# Patient Record
Sex: Male | Born: 1985 | Race: Black or African American | Hispanic: No | Marital: Single
Health system: Midwestern US, Community
[De-identification: ages and names within clinical notes are randomized; demographics above are authoritative.]

## PROBLEM LIST (undated history)

## (undated) DIAGNOSIS — M5412 Radiculopathy, cervical region: Secondary | ICD-10-CM

---
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
## 2007-01-28 ENCOUNTER — Ambulatory Visit (HOSPITAL_COMMUNITY): Admission: RE | Admit: 2007-01-28 | Discharge: 2007-01-28 | Payer: Self-pay | Admitting: Family Medicine

## 2010-08-26 NOTE — ED Notes (Signed)
Pt to xray via W/C / ice pack on ankle  / med already given by Basilia Jumbo RN.
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
## 2010-08-26 NOTE — ED Notes (Signed)
Patient reports hearing pop and feeling a crack to right ankle in ball injury after stepping on another player.

## 2010-08-26 NOTE — ED Notes (Signed)
Pt back to room / xray completed.

## 2010-08-26 NOTE — ED Notes (Signed)
Crutches with instructions given / ACE wrap already applied to right ankle by PA.

## 2010-08-26 NOTE — ED Notes (Signed)
Pt d/c'd, via W/C.  Pain 10 out of 10 per pt.  Prescrip and instructions given / pt verbalized understanding and signed out manually.

## 2010-08-27 MED ORDER — TRAMADOL 50 MG TAB
50 mg | ORAL_TABLET | Freq: Four times a day (QID) | ORAL | Status: AC | PRN
Start: 2010-08-27 — End: 2010-08-31

## 2010-08-27 MED ORDER — HYDROCODONE-ACETAMINOPHEN 5 MG-325 MG TAB
5-325 mg | ORAL | Status: AC
Start: 2010-08-27 — End: 2010-08-26
  Administered 2010-08-27: 02:00:00 via ORAL

## 2012-03-06 DIAGNOSIS — X58XXXA Exposure to other specified factors, initial encounter: Secondary | ICD-10-CM | POA: Insufficient documentation

## 2012-03-06 DIAGNOSIS — R5381 Other malaise: Secondary | ICD-10-CM | POA: Insufficient documentation

## 2012-03-06 DIAGNOSIS — R5383 Other fatigue: Secondary | ICD-10-CM | POA: Insufficient documentation

## 2012-03-06 DIAGNOSIS — M25579 Pain in unspecified ankle and joints of unspecified foot: Secondary | ICD-10-CM | POA: Insufficient documentation

## 2012-03-07 ENCOUNTER — Emergency Department (HOSPITAL_COMMUNITY): Payer: Self-pay

## 2012-03-07 ENCOUNTER — Encounter (HOSPITAL_COMMUNITY): Payer: Self-pay | Admitting: Emergency Medicine

## 2012-03-07 ENCOUNTER — Emergency Department (HOSPITAL_COMMUNITY)
Admission: EM | Admit: 2012-03-07 | Discharge: 2012-03-07 | Disposition: A | Discharge status: Home / Self Care | Payer: Self-pay | Attending: Emergency Medicine | Admitting: Emergency Medicine

## 2012-03-07 DIAGNOSIS — M25572 Pain in left ankle and joints of left foot: Secondary | ICD-10-CM

## 2012-03-07 MED ORDER — ACETAMINOPHEN-CODEINE #3 300-30 MG PO TABS: 1.0000 | ORAL_TABLET | Freq: Four times a day (QID) | ORAL | Status: AC | PRN

## 2012-03-07 NOTE — ED Provider Notes (Signed)
History     CSN: [id number]  Arrival date & time 03/06/12  2349   First MD Initiated Contact with Patient 03/07/12 [phone]      Chief Complaint  Patient presents with  . Ankle Pain    (Consider location/radiation/quality/duration/timing/severity/associated sxs/prior treatment) Patient is a 26 y.o. male presenting with ankle pain. The history is provided by the patient.  Ankle Pain  The incident occurred less than 1 hour ago. The incident occurred at the gym. Injury mechanism: unsure. The pain is present in the left ankle. The quality of the pain is described as throbbing. The pain is moderate. The pain has been intermittent since onset. Associated symptoms include inability to bear weight and loss of motion. Pertinent negatives include no numbness, no muscle weakness, no loss of sensation and no tingling. He reports no foreign bodies present. The symptoms are aggravated by bearing weight. He has tried nothing for the symptoms.   Pt states he was playing basketball earlier this evening; he did not suffer any known, specific, injury to his ankle, but thought he may have gotten kicked in the posterior calf on one play. When he took a break, he began to notice pain to the posterior L ankle and bottom of his heel, and was limping. States as time progressed, he began to have increased pain to the area and stated it was hard to flex the ankle, especially with walking. He has not noted any deformity to the area. He is very concerned about possible Achilles injury.  History reviewed. No pertinent past medical history.  History reviewed. No pertinent past surgical history.  History reviewed. No pertinent family history.  History  Substance Use Topics  . Smoking status: Never Smoker   . Smokeless tobacco: Not on file  . Alcohol Use: No      Review of Systems  Constitutional:       Negative  Musculoskeletal: Positive for arthralgias.  Skin: Negative for color change and rash.  Neurological:  Positive for weakness. Negative for tingling and numbness.    Allergies  Review of patient's allergies indicates no known allergies.  Home Medications   Current Outpatient Rx  Name Route Sig Dispense Refill  . ACETAMINOPHEN-CODEINE #3 300-30 MG PO TABS Oral Take 1-2 tablets by mouth every 6 (six) hours as needed for pain. 15 tablet 0    BP 127/54  Pulse 69  Temp(Src) 97.7 F (36.5 C) (Oral)  Resp 20  SpO2 98%  Physical Exam  Nursing note and vitals reviewed. Constitutional: He appears well-developed and well-nourished. No distress.  HENT:  Head: Normocephalic and atraumatic.  Neck: Normal range of motion.  Cardiovascular: Normal rate.   Pulmonary/Chest: Effort normal.  Musculoskeletal: Normal range of motion.       L ankle: tender to palpation over plantar surface of calcaneus. Achilles intact to [facility] test. Pt with reduced ROM 2/2 pain in ankle. No palp stepoff, crepitus, deformity. Calf supple, NT. Neurovascularly intact with sensory intact to light touch. Good DP/PT pulses. Capillary refill less than 3 seconds.  Pt able to bear wt but cannot take a step.  Neurological: He is alert.  Skin: Skin is warm and dry. He is not diaphoretic.  Psychiatric: He has a normal mood and affect.       Anxious about his condition    ED Course  Procedures (including critical care time)  Labs Reviewed - No data to display Dg Ankle Complete Left  03/07/2012  *RADIOLOGY REPORT*  Clinical Data: Ankle injury.  LEFT ANKLE COMPLETE - 3+ VIEW  Comparison: None.  Findings: There is no evidence for fracture, subluxation or dislocation.  No worrisome lytic or sclerotic osseous lesion.  IMPRESSION: No acute bony findings.  Original Report Authenticated By: ERIC A. MANSELL, M.D.     1. Left ankle pain       MDM  Pt presents with posterior ankle pain after playing basketball. He is very concerned about an achilles rupture. On my exam, his Achilles is definitely intact to [facility] test and  there is no palpable deformity to the calf. He does have some slight tenderness to the plantar surface of calcaneus. I personally reviewed plain films, which are negative. He is unable to wt bear/walk without pain so will place in posterior splint and on crutches; will have him f/u with ortho. RICE advised. Return precautions discussed.        Grant Fontana, [place] 03/07/12 [phone]

## 2012-03-07 NOTE — ED Notes (Signed)
Ortho tech at pt bedside 

## 2012-03-07 NOTE — ED Notes (Signed)
Pt states he was playing basketball and injured his left ankle  Pt states he has pain in his heel up the back of his ankle area  Pt states it does not feel right  No swelling noted  Pt states he can flex it a little

## 2012-03-07 NOTE — Discharge Instructions (Signed)
Your xrays did not show any broken bones. Your Achilles tendon appears to be intact on exam. You have been placed in a splint and on crutches for comfort. Please follow up with Dr. Montez Morita in clinic in the next 1-2 days for a recheck. If you develop increased swelling, numbness, weakness, or any other worrisome symptoms, please return to the ED.  Ankle Pain Ankle pain is a common symptom. The bones, cartilage, tendons, and muscles of the ankle joint perform a lot of work each day. The ankle joint holds your body weight and allows you to move around. Ankle pain can occur on either side or back of 1 or both ankles. Ankle pain may be sharp and burning or dull and aching. There may be tenderness, stiffness, redness, or warmth around the ankle. The pain occurs more often when a person walks or puts pressure on the ankle. CAUSES  There are many reasons ankle pain can develop. It is important to work with your caregiver to identify the cause since many conditions can impact the bones, cartilage, muscles, and tendons. Causes for ankle pain include:  Injury, including a break (fracture), sprain, or strain often due to a fall, sports, or a high-impact activity.   Swelling (inflammation) of a tendon (tendonitis).   Achilles tendon rupture.   Ankle instability after repeated sprains and strains.   Poor foot alignment.   Pressure on a nerve (tarsal tunnel syndrome).   Arthritis in the ankle or the lining of the ankle.   Crystal formation in the ankle (gout or pseudogout).  DIAGNOSIS  A diagnosis is based on your medical history, your symptoms, results of your physical exam, and results of diagnostic tests. Diagnostic tests may include X-ray exams or a computerized magnetic scan (magnetic resonance imaging, MRI). TREATMENT  Treatment will depend on the cause of your ankle pain and may include:  Keeping pressure off the ankle and limiting activities.   Using crutches or other walking support (a cane or  brace).   Using rest, ice, compression, and elevation.   Participating in physical therapy or home exercises.   Wearing shoe inserts or special shoes.   Losing weight.   Taking medications to reduce pain or swelling or receiving an injection.   Undergoing surgery.  HOME CARE INSTRUCTIONS   Only take over-the-counter or prescription medicines for pain, discomfort, or fever as directed by your caregiver.   Put ice on the injured area.   Put ice in a plastic bag.   Place a towel between your skin and the bag.   Leave the ice on for 15 to 20 minutes at a time, 3 to 4 times a day.   Keep your leg raised (elevated) when possible to lessen swelling.   Avoid activities that cause ankle pain.   Follow specific exercises as directed by your caregiver.   Record how often you have ankle pain, the location of the pain, and what it feels like. This information may be helpful to you and your caregiver.   Ask your caregiver about returning to work or sports and whether you should drive.   Follow up with your caregiver for further examination, therapy, or testing as directed.  SEEK MEDICAL CARE IF:   Pain or swelling continues or worsens beyond 1 week.   You have an oral temperature above 102 F (38.9 C).   You are feeling unwell or have chills.   You are having an increasingly difficult time with walking.   You have loss  of sensation or other new symptoms.   You have questions or concerns.  MAKE SURE YOU:   Understand these instructions.   Will watch your condition.   Will get help right away if you are not doing well or get worse.  Document Released: 04/06/2010 Document Revised: 10/06/2011 Document Reviewed: 04/06/2010 [facility] Patient Information 2012 [place], [place].

## 2012-03-10 NOTE — ED Provider Notes (Signed)
Medical screening examination/treatment/procedure(s) were performed by non-physician practitioner and as supervising physician I was immediately available for consultation/collaboration.   Suzi Roots, MD 03/10/12 [phone]

## 2012-03-15 ENCOUNTER — Encounter (HOSPITAL_COMMUNITY): Payer: Self-pay | Admitting: Emergency Medicine

## 2012-03-15 ENCOUNTER — Emergency Department (HOSPITAL_COMMUNITY)
Admission: EM | Admit: 2012-03-15 | Discharge: 2012-03-15 | Disposition: A | Discharge status: Home / Self Care | Payer: Self-pay | Attending: Emergency Medicine | Admitting: Emergency Medicine

## 2012-03-15 ENCOUNTER — Emergency Department (HOSPITAL_COMMUNITY): Payer: Self-pay

## 2012-03-15 DIAGNOSIS — M79609 Pain in unspecified limb: Secondary | ICD-10-CM | POA: Insufficient documentation

## 2012-03-15 DIAGNOSIS — M79673 Pain in unspecified foot: Secondary | ICD-10-CM

## 2012-03-15 MED ORDER — IBUPROFEN 400 MG PO TABS: 400.0000 mg | ORAL_TABLET | Freq: Four times a day (QID) | ORAL | Status: AC | PRN

## 2012-03-15 MED ORDER — OXYCODONE-ACETAMINOPHEN 5-325 MG PO TABS: 1.0000 | ORAL_TABLET | ORAL | Status: AC | PRN

## 2012-03-15 NOTE — ED Notes (Signed)
Pt seen here Tuesday after injuring left ankle/foot while playing basketball. Pt reports his cast got wet and fell off. Pt still c/o of pain to heel and unable to walk. Pt back for a recheck.

## 2012-03-15 NOTE — Progress Notes (Signed)
ED CM noted no pcp with [place] residence listed Cm spoke with pt who states he has no coverage but states he is not a resident of [place] No resources available

## 2012-03-15 NOTE — ED Provider Notes (Signed)
History    26yM with L heel pain. Persistent since last Tuesday. Injured while playing basketball but cant describe exact mechanism. Evaluated in ED then and had normal films of ankle. Splinted and referred to ortho but has not followed up. Swelling and pain improving but still having pain when bears weight on heel. Little pain at rest. No numbness, tingling or loss of strength. No other complaints.  CSN: [id number]  Arrival date & time 03/15/12  1319   First MD Initiated Contact with Patient 03/15/12 1348      No chief complaint on file.   (Consider location/radiation/quality/duration/timing/severity/associated sxs/prior treatment) HPI  History reviewed. No pertinent past medical history.  History reviewed. No pertinent past surgical history.  No family history on file.  History  Substance Use Topics  . Smoking status: Never Smoker   . Smokeless tobacco: Not on file  . Alcohol Use: No      Review of Systems   Review of symptoms negative unless otherwise noted in HPI.   Allergies  Review of patient's allergies indicates no known allergies.  Home Medications   Current Outpatient Rx  Name Route Sig Dispense Refill  . ACETAMINOPHEN-CODEINE #3 300-30 MG PO TABS Oral Take 1-2 tablets by mouth every 6 (six) hours as needed for pain. 15 tablet 0  . IBUPROFEN 400 MG PO TABS Oral Take 1 tablet (400 mg total) by mouth every 6 (six) hours as needed for pain. 60 tablet 0  . OXYCODONE-ACETAMINOPHEN 5-325 MG PO TABS Oral Take 1-2 tablets by mouth every 4 (four) hours as needed for pain. 15 tablet 0    BP 113/80  Pulse 55  Temp 98.7 F (37.1 C)  Resp 18  SpO2 100%  Physical Exam  Nursing note and vitals reviewed. Constitutional: He appears well-developed and well-nourished. No distress.  HENT:  Head: Normocephalic and atraumatic.  Eyes: Conjunctivae are normal. Right eye exhibits no discharge. Left eye exhibits no discharge.  Neck: Neck supple.  Cardiovascular: Normal  rate, regular rhythm and normal heart sounds.  Exam reveals no gallop and no friction rub.   No murmur heard. Pulmonary/Chest: Effort normal and breath sounds normal. No respiratory distress.  Musculoskeletal: He exhibits tenderness. He exhibits no edema.       L foot and ankle grossly normal and symmetric as compared to L. Tenderness to plantar aspect of posterior calcaneus. No overlying skin changes and neurovascularly intact distally. No tenderness along insertion site of achilles into calcaneus. Normal thompson's test.   Neurological: He is alert.  Skin: Skin is warm and dry.  Psychiatric: He has a normal mood and affect. His behavior is normal. Thought content normal.    ED Course  Procedures (including critical care time)  Labs Reviewed - No data to display Dg Foot Complete Left  03/15/2012  *RADIOLOGY REPORT*  Clinical Data: Left heel pain.  LEFT FOOT - COMPLETE 3+ VIEW  Comparison: None.  Findings: No acute bony abnormality.  Specifically, no fracture, subluxation, or dislocation.  Soft tissues are intact.  IMPRESSION: Unremarkable study.  Original Report Authenticated By: Cyndie Chime, M.D.     1. Heel pain       MDM  26yM with persistent heel pain. XR neg for fx. May have occult calcaneus fx. CT/MRI will not change tx or disposition at this time though. Will resplint. Pain meds and ortho fu. Return precautions discussed.         Raeford Razor, MD 03/15/12 [phone]

## 2012-11-15 ENCOUNTER — Emergency Department (HOSPITAL_COMMUNITY)
Admission: EM | Admit: 2012-11-15 | Discharge: 2012-11-15 | Disposition: A | Discharge status: Home / Self Care | Payer: No Typology Code available for payment source | Attending: Emergency Medicine | Admitting: Emergency Medicine

## 2012-11-15 ENCOUNTER — Encounter (HOSPITAL_COMMUNITY): Payer: Self-pay

## 2012-11-15 ENCOUNTER — Emergency Department (HOSPITAL_COMMUNITY): Payer: No Typology Code available for payment source

## 2012-11-15 DIAGNOSIS — Y9389 Activity, other specified: Secondary | ICD-10-CM | POA: Insufficient documentation

## 2012-11-15 DIAGNOSIS — S8992XA Unspecified injury of left lower leg, initial encounter: Secondary | ICD-10-CM

## 2012-11-15 DIAGNOSIS — IMO0002 Reserved for concepts with insufficient information to code with codable children: Secondary | ICD-10-CM | POA: Insufficient documentation

## 2012-11-15 DIAGNOSIS — S8990XA Unspecified injury of unspecified lower leg, initial encounter: Secondary | ICD-10-CM | POA: Insufficient documentation

## 2012-11-15 DIAGNOSIS — S99919A Unspecified injury of unspecified ankle, initial encounter: Secondary | ICD-10-CM | POA: Insufficient documentation

## 2012-11-15 DIAGNOSIS — Y929 Unspecified place or not applicable: Secondary | ICD-10-CM | POA: Insufficient documentation

## 2012-11-15 MED ORDER — IBUPROFEN 600 MG PO TABS: 600.0000 mg | ORAL_TABLET | Freq: Four times a day (QID) | ORAL | Status: DC | PRN

## 2012-11-15 MED ORDER — IBUPROFEN 400 MG PO TABS
800.0000 mg | ORAL_TABLET | Freq: Once | ORAL | Status: AC
  Administered 2012-11-15: 800 mg via ORAL
  Filled 2012-11-15: qty 2

## 2012-11-15 MED ORDER — METHOCARBAMOL 500 MG PO TABS: 500.0000 mg | ORAL_TABLET | Freq: Two times a day (BID) | ORAL | Status: DC

## 2012-11-15 NOTE — ED Notes (Signed)
Pt at radiology  ?

## 2012-11-15 NOTE — ED Provider Notes (Signed)
Medical screening examination/treatment/procedure(s) were performed by non-physician practitioner and as supervising physician I was immediately available for consultation/collaboration.   Kyrstin Campillo, MD 11/15/12 1425 
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
## 2013-09-09 ENCOUNTER — Emergency Department (HOSPITAL_COMMUNITY)
Admission: EM | Admit: 2013-09-09 | Discharge: 2013-09-09 | Disposition: A | Discharge status: Home / Self Care | Payer: Self-pay | Attending: Emergency Medicine | Admitting: Emergency Medicine

## 2013-09-09 ENCOUNTER — Emergency Department (HOSPITAL_COMMUNITY): Payer: Self-pay

## 2013-09-09 ENCOUNTER — Encounter (HOSPITAL_COMMUNITY): Payer: Self-pay | Admitting: Emergency Medicine

## 2013-09-09 DIAGNOSIS — S8990XA Unspecified injury of unspecified lower leg, initial encounter: Secondary | ICD-10-CM | POA: Insufficient documentation

## 2013-09-09 DIAGNOSIS — Y9367 Activity, basketball: Secondary | ICD-10-CM | POA: Insufficient documentation

## 2013-09-09 DIAGNOSIS — Y9239 Other specified sports and athletic area as the place of occurrence of the external cause: Secondary | ICD-10-CM | POA: Insufficient documentation

## 2013-09-09 DIAGNOSIS — M25561 Pain in right knee: Secondary | ICD-10-CM

## 2013-09-09 DIAGNOSIS — X500XXA Overexertion from strenuous movement or load, initial encounter: Secondary | ICD-10-CM | POA: Insufficient documentation

## 2013-09-09 MED ORDER — TRAMADOL HCL 50 MG PO TABS: 50.0000 mg | ORAL_TABLET | Freq: Four times a day (QID) | ORAL | Status: DC | PRN

## 2013-09-09 MED ORDER — IBUPROFEN 600 MG PO TABS: 600.0000 mg | ORAL_TABLET | Freq: Four times a day (QID) | ORAL | Status: DC | PRN

## 2013-09-09 NOTE — ED Provider Notes (Signed)
CSN: [id number]     Arrival date & time 09/09/13  2045 History   First MD Initiated Contact with Patient 09/09/13 2111    This chart was scribed for [name] PA-C, a non-physician practitioner working with No att. providers found by Lewanda Rife, ED Scribe. This patient was seen in room WTR9/WTR9 and the patient's care was started at 5:15 AM   Chief Complaint  Patient presents with  . Knee Injury   (Consider location/radiation/quality/duration/timing/severity/associated sxs/prior Treatment) The history is provided by the patient. No language interpreter was used.   HPI Comments: Paul Cole is a 27 y.o. male who presents to the Emergency Department complaining of constant severe right knee pain onset 1.5 hours ago while playing basketball hyperextended knee after going for a rebound. Reports associated mild swelling. Reports pain is exacerbated by touch, flexion/extension, and weight bearing. Denies associated other injuries, numbness, and weakness.   History reviewed. No pertinent past medical history. History reviewed. No pertinent past surgical history. History reviewed. No pertinent family history. History  Substance Use Topics  . Smoking status: Never Smoker   . Smokeless tobacco: Not on file  . Alcohol Use: No    Review of Systems  Musculoskeletal: Positive for arthralgias.   A complete 10 system review of systems was obtained and all systems are negative except as noted in the HPI and PMHx.    Allergies  Review of patient's allergies indicates no known allergies.  Home Medications   Current Outpatient Rx  Name  Route  Sig  Dispense  Refill  . ibuprofen (ADVIL,MOTRIN) 600 MG tablet   Oral   Take 1 tablet (600 mg total) by mouth every 6 (six) hours as needed.   30 tablet   0   . traMADol (ULTRAM) 50 MG tablet   Oral   Take 1 tablet (50 mg total) by mouth every 6 (six) hours as needed.   15 tablet   0    BP 114/68  Pulse 56  Temp(Src) 98.2 F (36.8  C) (Oral)  Resp 16  SpO2 100%  Physical Exam  Nursing note and vitals reviewed. Constitutional: He is oriented to person, place, and time. He appears well-developed and well-nourished. No distress.  HENT:  Head: Normocephalic and atraumatic.  Eyes: Conjunctivae and EOM are normal. No scleral icterus.  Neck: Normal range of motion.  Cardiovascular: Normal rate, regular rhythm, intact distal pulses and normal pulses.   Pulses:      Dorsalis pedis pulses are 2+ on the right side.       Posterior tibial pulses are 2+ on the right side.  Pulmonary/Chest: Effort normal. No respiratory distress.  Musculoskeletal: Normal range of motion. He exhibits tenderness.  TTP of tibial tuberosity of RLE. Pain with flexion and extension of R knee joint, but normal strength against resistance. Normal sensation and DTRs normal in right lower extremity.  Neurological: He is alert and oriented to person, place, and time. He has normal strength and normal reflexes. No sensory deficit.  Patient weight bearing and ambulatory; antalgic gait   Skin: Skin is warm and dry. No rash noted. He is not diaphoretic. No erythema. No pallor.  Psychiatric: He has a normal mood and affect. His behavior is normal.    ED Course  Procedures (including critical care time) COORDINATION OF CARE:  Nursing notes reviewed. Vital signs reviewed. Initial pt interview and examination performed.   5:15 AM-Discussed work up plan with pt at bedside, which includes right knee x-ray. Pt agrees  with plan.  5:15 AM Nursing Notes Reviewed/ Care Coordinated Applicable Imaging Reviewed and incorporated into ED treatment Discussed results and treatment plan with pt. Pt demonstrates understanding and agrees with plan to wear knee immobilizer, use crutches, and f/u with orthopedist.  Treatment plan initiated:Medications - No data to display  Initial diagnostic testing ordered.    Labs Review Labs Reviewed - No data to display Imaging  Review Dg Knee Complete 4 Views Right  09/09/2013   CLINICAL DATA:  Right knee pain after injury.  EXAM: RIGHT KNEE - COMPLETE 4+ VIEW  COMPARISON:  None.  FINDINGS: There is no evidence of fracture, dislocation, or joint effusion. There is no evidence of arthropathy or other focal bone abnormality. Soft tissues are unremarkable.  IMPRESSION: Normal right knee.   Electronically Signed   By: Roque Lias M.D.   On: 09/09/2013 21:08    EKG Interpretation   None       MDM   1. Knee pain, acute, right    Patient presents for knee pain with onset while playing basketball today. Patient neurovascularly intact with normal strength against resistance with extension and flexion of R knee. No obvious laxity or deformities appreciated. Patient ambulatory with antalgic gait. Xray without any fracture or dislocation. Patient to be placed in a knee immobilizer and given crutches. RICE advised and ortho referral provided for follow up. Return precautions discussed and patient agreeable to plan with no unaddressed concerns.  I personally performed the services described in this documentation, which was scribed in my presence. The recorded information has been reviewed and is accurate.     [name], [place] 09/10/13 [phone]

## 2013-09-09 NOTE — ED Notes (Signed)
Pt states he was playing basketball and jumped up for a rebound and came down injuring his right knee  Pt states it happened about 30 min ago

## 2013-09-11 NOTE — ED Provider Notes (Signed)
Medical screening examination/treatment/procedure(s) were performed by non-physician practitioner and as supervising physician I was immediately available for consultation/collaboration.  EKG Interpretation   None         Benny Lennert, MD 09/11/13 [phone]

## 2013-12-24 ENCOUNTER — Encounter (HOSPITAL_COMMUNITY): Payer: Self-pay | Admitting: Emergency Medicine

## 2013-12-24 ENCOUNTER — Emergency Department (INDEPENDENT_AMBULATORY_CARE_PROVIDER_SITE_OTHER)
Admission: EM | Admit: 2013-12-24 | Discharge: 2013-12-24 | Disposition: A | Discharge status: Home / Self Care | Payer: Self-pay | Source: Home / Self Care

## 2013-12-24 DIAGNOSIS — A084 Viral intestinal infection, unspecified: Secondary | ICD-10-CM

## 2013-12-24 DIAGNOSIS — E861 Hypovolemia: Secondary | ICD-10-CM

## 2013-12-24 DIAGNOSIS — A088 Other specified intestinal infections: Secondary | ICD-10-CM

## 2013-12-24 DIAGNOSIS — E860 Dehydration: Secondary | ICD-10-CM

## 2013-12-24 DIAGNOSIS — R112 Nausea with vomiting, unspecified: Secondary | ICD-10-CM

## 2013-12-24 MED ORDER — SODIUM CHLORIDE 0.9 % IV SOLN
Freq: Once | INTRAVENOUS | Status: AC
  Administered 2013-12-24: 14:00:00 via INTRAVENOUS

## 2013-12-24 MED ORDER — ONDANSETRON 4 MG PO TBDP
ORAL_TABLET | ORAL | Status: AC
  Filled 2013-12-24: qty 1

## 2013-12-24 MED ORDER — ONDANSETRON 4 MG PO TBDP
4.0000 mg | ORAL_TABLET | Freq: Once | ORAL | Status: AC
  Administered 2013-12-24: 4 mg via ORAL

## 2013-12-24 MED ORDER — ONDANSETRON HCL 4 MG PO TABS: 4.0000 mg | ORAL_TABLET | Freq: Four times a day (QID) | ORAL | Status: DC

## 2013-12-24 NOTE — ED Provider Notes (Signed)
CSN: [id number]     Arrival date & time 12/24/13  1232 History   First MD Initiated Contact with Patient 12/24/13 1308     Chief Complaint  Patient presents with  . Emesis  . Diarrhea     (Consider location/radiation/quality/duration/timing/severity/associated sxs/prior Treatment) HPI Comments: 28 year old male states that after eating at [employer] PM yesterday he developed vomiting and diarrhea several hours later. He estimates he vomited approximately 5 times and diarrhea too numerous to count. He also did complain of abdominal pain and cramping. He is unable to keep any type of fluid or food down.   History reviewed. No pertinent past medical history. History reviewed. No pertinent past surgical history. History reviewed. No pertinent family history. History  Substance Use Topics  . Smoking status: Never Smoker   . Smokeless tobacco: Not on file  . Alcohol Use: No    Review of Systems  Constitutional: Positive for activity change and appetite change. Negative for fever.  HENT: Negative.   Respiratory: Negative.   Cardiovascular: Negative.   Gastrointestinal: Positive for nausea, vomiting, abdominal pain and diarrhea.  Genitourinary: Negative.   Skin: Negative.   Neurological: Negative for tremors, syncope, speech difficulty and headaches.      Allergies  Review of patient's allergies indicates no known allergies.  Home Medications   Current Outpatient Rx  Name  Route  Sig  Dispense  Refill  . ibuprofen (ADVIL,MOTRIN) 600 MG tablet   Oral   Take 1 tablet (600 mg total) by mouth every 6 (six) hours as needed.   30 tablet   0   . ondansetron (ZOFRAN) 4 MG tablet   Oral   Take 1 tablet (4 mg total) by mouth every 6 (six) hours.   12 tablet   0   . traMADol (ULTRAM) 50 MG tablet   Oral   Take 1 tablet (50 mg total) by mouth every 6 (six) hours as needed.   15 tablet   0    BP 118/83  Pulse 107  Temp(Src) 100.4 F (38 C) (Oral)  Resp 20  SpO2  98% Physical Exam  Nursing note and vitals reviewed. Constitutional: He is oriented to person, place, and time. He appears well-developed and well-nourished. No distress.  HENT:  Mouth/Throat: Oropharynx is clear and moist. No oropharyngeal exudate.  Eyes: Conjunctivae and EOM are normal.  Neck: Normal range of motion. Neck supple.  Cardiovascular: Normal rate, regular rhythm and normal heart sounds.   Pulmonary/Chest: Effort normal and breath sounds normal. No respiratory distress. He has no wheezes. He has no rales.  Abdominal: Soft. Bowel sounds are normal. He exhibits no distension. There is no tenderness. There is no rebound and no guarding.  Proptosis tympanic in all 4 quadrants  Musculoskeletal: He exhibits no edema and no tenderness.  Lymphadenopathy:    He has no cervical adenopathy.  Neurological: He is alert and oriented to person, place, and time. He exhibits normal muscle tone.  Skin: Skin is warm and dry.  Psychiatric: He has a normal mood and affect.    ED Course  Procedures (including critical care time) Labs Review Labs Reviewed - No data to display Imaging Review No results found.    MDM   Final diagnoses:  Viral gastroenteritis  Nausea and vomiting  Dehydration  Hypovolemia   Patient received 1 L of normal saline in the urgent care Discharge home in stable and improved condition He was administered Zofran 4 mg sublingually Rx for Zofran directed Clear  liquids for the next 24 hours then slowly advance diet as tolerated in anger instructions From more than 3 or 4 watery diarrhea stools a day he may take one Imodium right ear every 6-8 hours just to slow the diarrhea but not stop it.     Hayden Rasmussen, NP 12/24/13 1435

## 2013-12-24 NOTE — ED Notes (Signed)
Iv ns  1  Liter   Bolus     Via  18  Angio  r   arm

## 2013-12-24 NOTE — ED Notes (Signed)
C/o vomiting and diarrhea after eating [employer] yesterday evening at 4 p.m.  Denies any new foods or meds.  States "unable to keep anything down".  No meds taken for symptoms.

## 2013-12-24 NOTE — Discharge Instructions (Signed)
Dehydration, Adult Dehydration means your body does not have as much fluid as it needs. Your kidneys, brain, and heart will not work properly without the right amount of fluids and salt.  HOME CARE  Ask your doctor how to replace body fluid losses (rehydrate).  Drink enough fluids to keep your pee (urine) clear or pale yellow.  Drink small amounts of fluids often if you feel sick to your stomach (nauseous) or throw up (vomit).  Eat like you normally do.  Avoid:  Foods or drinks high in sugar.  Bubbly (carbonated) drinks.  Juice.  Very hot or cold fluids.  Drinks with caffeine.  Fatty, greasy foods.  Alcohol.  Tobacco.  Eating too much.  Gelatin desserts.  Wash your hands to avoid spreading germs (bacteria, viruses).  Only take medicine as told by your doctor.  Keep all doctor visits as told. GET HELP RIGHT AWAY IF:   You cannot drink something without throwing up.  You get worse even with treatment.  Your vomit has blood in it or looks greenish.  Your poop (stool) has blood in it or looks black and tarry.  You have not peed in 6 to 8 hours.  You pee a small amount of very dark pee.  You have a fever.  You pass out (faint).  You have belly (abdominal) pain that gets worse or stays in one spot (localizes).  You have a rash, stiff neck, or bad headache.  You get easily annoyed, sleepy, or are hard to wake up.  You feel weak, dizzy, or very thirsty. MAKE SURE YOU:   Understand these instructions.  Will watch your condition.  Will get help right away if you are not doing well or get worse. Document Released: 08/13/2009 Document Revised: 01/09/2012 Document Reviewed: 06/06/2011 [facility] Patient Information 2014 [place], [place].  Viral Gastroenteritis Viral gastroenteritis is also known as stomach flu. This condition affects the stomach and intestinal tract. It can cause sudden diarrhea and vomiting. The illness typically lasts 3 to 8 days. Most  people develop an immune response that eventually gets rid of the virus. While this natural response develops, the virus can make you quite ill. CAUSES  Many different viruses can cause gastroenteritis, such as rotavirus or noroviruses. You can catch one of these viruses by consuming contaminated food or water. You may also catch a virus by sharing utensils or other personal items with an infected person or by touching a contaminated surface. SYMPTOMS  The most common symptoms are diarrhea and vomiting. These problems can cause a severe loss of body fluids (dehydration) and a body salt (electrolyte) imbalance. Other symptoms may include:  Fever.  Headache.  Fatigue.  Abdominal pain. DIAGNOSIS  Your caregiver can usually diagnose viral gastroenteritis based on your symptoms and a physical exam. A stool sample may also be taken to test for the presence of viruses or other infections. TREATMENT  This illness typically goes away on its own. Treatments are aimed at rehydration. The most serious cases of viral gastroenteritis involve vomiting so severely that you are not able to keep fluids down. In these cases, fluids must be given through an intravenous line (IV). HOME CARE INSTRUCTIONS   Drink enough fluids to keep your urine clear or pale yellow. Drink small amounts of fluids frequently and increase the amounts as tolerated.  Ask your caregiver for specific rehydration instructions.  Avoid:  Foods high in sugar.  Alcohol.  Carbonated drinks.  Tobacco.  Juice.  Caffeine drinks.  Extremely hot  or cold fluids.  Fatty, greasy foods.  Too much intake of anything at one time.  Dairy products until 24 to 48 hours after diarrhea stops.  You may consume probiotics. Probiotics are active cultures of beneficial bacteria. They may lessen the amount and number of diarrheal stools in adults. Probiotics can be found in yogurt with active cultures and in supplements.  Wash your hands  well to avoid spreading the virus.  Only take over-the-counter or prescription medicines for pain, discomfort, or fever as directed by your caregiver. Do not give aspirin to children. Antidiarrheal medicines are not recommended.  Ask your caregiver if you should continue to take your regular prescribed and over-the-counter medicines.  Keep all follow-up appointments as directed by your caregiver. SEEK IMMEDIATE MEDICAL CARE IF:   You are unable to keep fluids down.  You do not urinate at least once every 6 to 8 hours.  You develop shortness of breath.  You notice blood in your stool or vomit. This may look like coffee grounds.  You have abdominal pain that increases or is concentrated in one small area (localized).  You have persistent vomiting or diarrhea.  You have a fever.  The patient is a child younger than 3 months, and he or she has a fever.  The patient is a child older than 3 months, and he or she has a fever and persistent symptoms.  The patient is a child older than 3 months, and he or she has a fever and symptoms suddenly get worse.  The patient is a baby, and he or she has no tears when crying. MAKE SURE YOU:   Understand these instructions.  Will watch your condition.  Will get help right away if you are not doing well or get worse. Document Released: 10/17/2005 Document Revised: 01/09/2012 Document Reviewed: 08/03/2011 [facility] Patient Information 2014 [place], [place].  Rehydration, Adult Rehydration is the replacement of body fluids lost during dehydration. Dehydration is an extreme loss of body fluids to the point of body function impairment. There are many ways extreme fluid loss can occur, including vomiting, diarrhea, or excess sweating. Recovering from dehydration requires replacing lost fluids, continuing to eat to maintain strength, and avoiding foods and beverages that may contribute to further fluid loss or may increase nausea. HOW TO REHYDRATE In  most cases, rehydration involves the replacement of not only fluids but also carbohydrates and basic body salts. Rehydration with an oral rehydration solution is one way to replace essential nutrients lost through dehydration. An oral rehydration solution can be purchased at pharmacies, retail stores, and online. Premixed packets of powder that you combine with water to make a solution are also sold. You can prepare an oral rehydration solution at home by mixing the following ingredients together:      tsp table salt.   tsp baking soda.   tsp salt substitute containing potassium chloride.  1 tablespoons sugar.  1 L (34 oz) of water. Be sure to use exact measurements. Including too much sugar can make diarrhea worse. Drink  1 cup (120 240 mL) of oral rehydration solution each time you have diarrhea or vomit. If drinking this amount makes your vomiting worse, try drinking smaller amounts more often. For example, drink 1 3 tsp every 5 10 minutes.  A general rule for staying hydrated is to drink 1 2 L of fluid per day. Talk to your caregiver about the specific amount you should be drinking each day. Drink enough fluids to keep your urine  clear or pale yellow. EATING WHEN DEHYDRATED Even if you have had severe sweating or you are having diarrhea, do not stop eating. Many healthy items in a normal diet are okay to continue eating while recovering from dehydration. The following tips can help you to lessen nausea when you eat:  Ask someone else to prepare your food. Cooking smells may worsen nausea.  Eat in a well-ventilated room away from cooking smells.  Sit up when you eat. Avoid lying down until 1 2 hours after eating.  Eat small amounts when you eat.  Eat foods that are easy to digest. These include soft, well-cooked, or mashed foods. FOODS AND BEVERAGES TO AVOID Avoid eating or drinking the following foods and beverages that may increase nausea or further loss of fluid:   Fruit juices  with a high sugar content, such as concentrated juices.  Alcohol.  Beverages containing caffeine.  Carbonated drinks. They may cause a lot of gas.  Foods that may cause a lot of gas, such as cabbage, broccoli, and beans.  Fatty, greasy, and fried foods.  Spicy, very salty, and very sweet foods or drinks.  Foods or drinks that are very hot or very cold. Consume food or drinks at or near room temperature.  Foods that need a lot of chewing, such as raw vegetables.  Foods that are sticky or hard to swallow, such as peanut butter. Document Released: 01/09/2012 Document Revised: 07/11/2012 Document Reviewed: 01/09/2012 [facility] Patient Information 2014 [place], [place].  Nausea and Vomiting Nausea means you feel sick to your stomach. Throwing up (vomiting) is a reflex where stomach contents come out of your mouth. HOME CARE   Take medicine as told by your doctor.  Do not force yourself to eat. However, you do need to drink fluids.  If you feel like eating, eat a normal diet as told by your doctor.  Eat rice, wheat, potatoes, bread, lean meats, yogurt, fruits, and vegetables.  Avoid high-fat foods.  Drink enough fluids to keep your pee (urine) clear or pale yellow.  Ask your doctor how to replace body fluid losses (rehydrate). Signs of body fluid loss (dehydration) include:  Feeling very thirsty.  Dry lips and mouth.  Feeling dizzy.  Dark pee.  Peeing less than normal.  Feeling confused.  Fast breathing or heart rate. GET HELP RIGHT AWAY IF:   You have blood in your throw up.  You have black or bloody poop (stool).  You have a bad headache or stiff neck.  You feel confused.  You have bad belly (abdominal) pain.  You have chest pain or trouble breathing.  You do not pee at least once every 8 hours.  You have cold, clammy skin.  You keep throwing up after 24 to 48 hours.  You have a fever. MAKE SURE YOU:   Understand these instructions.  Will watch  your condition.  Will get help right away if you are not doing well or get worse. Document Released: 04/04/2008 Document Revised: 01/09/2012 Document Reviewed: 03/18/2011 [facility] Patient Information 2014 [place], [place].

## 2013-12-25 NOTE — ED Provider Notes (Signed)
Medical screening examination/treatment/procedure(s) were performed by a resident physician or non-physician practitioner and as the supervising physician I was immediately available for consultation/collaboration.  Evan Corey, MD    Evan S Corey, MD 12/25/13 0742 

## 2014-04-08 ENCOUNTER — Emergency Department (HOSPITAL_COMMUNITY)
Admission: EM | Admit: 2014-04-08 | Discharge: 2014-04-08 | Disposition: A | Discharge status: Home / Self Care | Payer: BC Managed Care – PPO | Attending: Emergency Medicine | Admitting: Emergency Medicine

## 2014-04-08 ENCOUNTER — Encounter (HOSPITAL_COMMUNITY): Payer: Self-pay | Admitting: Emergency Medicine

## 2014-04-08 DIAGNOSIS — M25569 Pain in unspecified knee: Secondary | ICD-10-CM | POA: Insufficient documentation

## 2014-04-08 DIAGNOSIS — M25469 Effusion, unspecified knee: Secondary | ICD-10-CM | POA: Insufficient documentation

## 2014-04-08 DIAGNOSIS — M25561 Pain in right knee: Secondary | ICD-10-CM

## 2014-04-08 DIAGNOSIS — G8921 Chronic pain due to trauma: Secondary | ICD-10-CM | POA: Insufficient documentation

## 2014-04-08 MED ORDER — IBUPROFEN 800 MG PO TABS
800.0000 mg | ORAL_TABLET | Freq: Once | ORAL | Status: AC
  Administered 2014-04-08: 800 mg via ORAL
  Filled 2014-04-08: qty 1

## 2014-04-08 MED ORDER — IBUPROFEN 800 MG PO TABS: 800.0000 mg | ORAL_TABLET | Freq: Three times a day (TID) | ORAL | Status: DC

## 2014-04-08 NOTE — Discharge Instructions (Signed)
Knee Pain Knee pain can be a result of an injury or other medical conditions. Treatment will depend on the cause of your pain. HOME CARE  Only take medicine as told by your doctor.  Keep a healthy weight. Being overweight can make the knee hurt more.  Stretch before exercising or playing sports.  If there is constant knee pain, change the way you exercise. Ask your doctor for advice.  Make sure shoes fit well. Choose the right shoe for the sport or activity.  Protect your knees. Wear kneepads if needed.  Rest when you are tired. GET HELP RIGHT AWAY IF:   Your knee pain does not stop.  Your knee pain does not get better.  Your knee joint feels hot to the touch.  You have a fever. MAKE SURE YOU:   Understand these instructions.  Will watch this condition.  Will get help right away if you are not doing well or get worse. Document Released: 01/13/2009 Document Revised: 01/09/2012 Document Reviewed: 01/13/2009 [employer] Patient Information 2014 [employer].  

## 2014-04-08 NOTE — ED Notes (Addendum)
Pt states he was seen in November for right pain and referred out, was told he had a partial tear in PCL and ACL and a fracture. Pt c/o right leg pain, states his knee swells up at times and he can't work. States leg will give out at times, pt is ambulatory.

## 2014-04-08 NOTE — ED Provider Notes (Signed)
CSN: [id number]     Arrival date & time 04/08/14  1139 History  This chart was scribed for non-physician practitioner Junious Silk, PA-C working with Lyanne Co, MD by Joaquin Music, ED Scribe. This patient was seen in room WTR6/WTR6 and the patient's care was started at 12:45 PM .   Chief Complaint  Patient presents with  . Leg Pain    right   The history is provided by the patient. No language interpreter was used.   HPI Comments: Paul Cole is a 28 y.o. male who presents to the Emergency Department complaining of chronic R leg pain that began this week but has been going on for several months. Pt states he was seen in the ED in November 2014 and was referred to an Orthopedic (Dr. Darrelyn Hillock) due to a tear to ACL and MCL. States he did therapy for a few months but states he is still having pain to R leg. He has pain that comes suddenly while seated or driving and generally is sharp; states he works with [employer] and does constant twisting motions and lifting. He wears a sleeve but denies any relief. Pt denies taking any OTC Ibuprofen and Advil.  History reviewed. No pertinent past medical history. History reviewed. No pertinent past surgical history. No family history on file. History  Substance Use Topics  . Smoking status: Never Smoker   . Smokeless tobacco: Not on file  . Alcohol Use: No    Review of Systems  Musculoskeletal: Positive for arthralgias and joint swelling. Negative for back pain and gait problem.  Skin: Negative for color change and wound.  All other systems reviewed and are negative.  Allergies  Review of patient's allergies indicates no known allergies.  Home Medications   Prior to Admission medications   Medication Sig Start Date End Date Taking? Authorizing Provider  ibuprofen (ADVIL,MOTRIN) 600 MG tablet Take 1 tablet (600 mg total) by mouth every 6 (six) hours as needed. 09/09/13   [name], PA-C  ondansetron (ZOFRAN) 4 MG tablet Take 1  tablet (4 mg total) by mouth every 6 (six) hours. 12/24/13   Hayden Rasmussen, NP  traMADol (ULTRAM) 50 MG tablet Take 1 tablet (50 mg total) by mouth every 6 (six) hours as needed. 09/09/13   [name], PA-C   BP 115/96  Pulse 67  Temp(Src) 97.4 F (36.3 C) (Oral)  Resp 19  SpO2 99%  Physical Exam  Nursing note and vitals reviewed. Constitutional: He is oriented to person, place, and time. He appears well-developed and well-nourished. No distress.  HENT:  Head: Normocephalic and atraumatic.  Eyes: EOM are normal.  Neck: Neck supple. No tracheal deviation present.  Cardiovascular: Normal rate.   Pulses:      Posterior tibial pulses are 2+ on the right side, and 2+ on the left side.  Pulmonary/Chest: Effort normal. No respiratory distress.  Musculoskeletal: Normal range of motion.  Tender to palpitation over posterior and lateral aspect of L knee. Compartment soft. Joint stable.   Neurological: He is alert and oriented to person, place, and time.  Neurovascularly intact.   Skin: Skin is warm and dry.  Psychiatric: He has a normal mood and affect. His behavior is normal.   ED Course  Procedures DIAGNOSTIC STUDIES: Oxygen Saturation is 99% on RA, normal by my interpretation.    COORDINATION OF CARE: 12:49 PM-Discussed treatment plan which includes encouraged pt to contact orthopedist for F/U and take OTC Advil for relief.  Pt agreed to plan.  Labs Review Labs Reviewed - No data to display  Imaging Review No results found.   EKG Interpretation None     MDM   Final diagnoses:  Right knee pain    Patient presents to ED with persistent right knee pain since injury in November. Joint is neurovascularly intact, compartment is soft. Sensation intact. Patient was encouraged to follow up with his orthopedic doctor and take advil at home for the pain. Return instructions given. Vital signs stable for discharge. Patient / Family / Caregiver informed of clinical course, understand  medical decision-making process, and agree with plan.   I personally performed the services described in this documentation, which was scribed in my presence. The recorded information has been reviewed and is accurate.    Mora BellmanHannah S Montana Bryngelson, PA-C 04/08/14 1300

## 2014-04-08 NOTE — ED Provider Notes (Signed)
Medical screening examination/treatment/procedure(s) were performed by non-physician practitioner and as supervising physician I was immediately available for consultation/collaboration.   Dutchess Crosland M Kayden Hutmacher, MD 04/08/14 1642 

## 2014-10-14 ENCOUNTER — Encounter (HOSPITAL_COMMUNITY): Payer: Self-pay | Admitting: Emergency Medicine

## 2014-10-14 ENCOUNTER — Emergency Department (HOSPITAL_COMMUNITY)
Admission: EM | Admit: 2014-10-14 | Discharge: 2014-10-14 | Disposition: A | Discharge status: Home / Self Care | Payer: BC Managed Care – PPO | Attending: Emergency Medicine | Admitting: Emergency Medicine

## 2014-10-14 DIAGNOSIS — X58XXXA Exposure to other specified factors, initial encounter: Secondary | ICD-10-CM | POA: Diagnosis not present

## 2014-10-14 DIAGNOSIS — S39012A Strain of muscle, fascia and tendon of lower back, initial encounter: Secondary | ICD-10-CM

## 2014-10-14 DIAGNOSIS — Y9389 Activity, other specified: Secondary | ICD-10-CM | POA: Diagnosis not present

## 2014-10-14 DIAGNOSIS — S3992XA Unspecified injury of lower back, initial encounter: Secondary | ICD-10-CM | POA: Diagnosis present

## 2014-10-14 DIAGNOSIS — Y9289 Other specified places as the place of occurrence of the external cause: Secondary | ICD-10-CM | POA: Insufficient documentation

## 2014-10-14 DIAGNOSIS — Y998 Other external cause status: Secondary | ICD-10-CM | POA: Insufficient documentation

## 2014-10-14 MED ORDER — CYCLOBENZAPRINE HCL 10 MG PO TABS: 10.0000 mg | ORAL_TABLET | Freq: Two times a day (BID) | ORAL | Status: DC | PRN

## 2014-10-14 MED ORDER — HYDROCODONE-ACETAMINOPHEN 5-325 MG PO TABS: 2.0000 | ORAL_TABLET | ORAL | Status: DC | PRN

## 2014-10-14 NOTE — ED Notes (Signed)
Pt c/o LBP x 2 weeks. Has been working 70/hrs/week at [employer].

## 2014-10-14 NOTE — ED Provider Notes (Signed)
CSN: [id number]     Arrival date & time 10/14/14  0954 History  This chart was scribed for non-physician practitioner Paul Cole, Paul Cole, working with Paul LennertJoseph L Zammit, MD by Littie Deedsichard Sun, ED Scribe. This patient was seen in room TR07C/TR07C and the patient's care was started at 10:29 AM.      Chief Complaint  Patient presents with  . Back Pain    Patient is a 28 y.o. male presenting with back pain. The history is provided by the patient. No language interpreter was used.  Back Pain Associated symptoms: no abdominal pain and no dysuria    HPI Comments: Paul Cole is a 28 y.o. male who presents to the Emergency Department complaining of gradual onset lower back pain that started months ago but worsened 2 weeks ago. Patient feels like one side of his back is swollen upon waking up. He has tried ibuprofen and heat for his pain. Patient denies any injury. He also denies any urinary symptoms, bowel symptoms, and abdominal pain.  Patient has been working 70 hours/week at [employer], lifting heavy boxes. He drove himself to the ED today.  History reviewed. No pertinent past medical history. History reviewed. No pertinent past surgical history. No family history on file. History  Substance Use Topics  . Smoking status: Never Smoker   . Smokeless tobacco: Not on file  . Alcohol Use: No    Review of Systems  Gastrointestinal: Negative for abdominal pain, diarrhea and constipation.  Genitourinary: Negative for dysuria, urgency, frequency, hematuria, decreased urine volume and difficulty urinating.  Musculoskeletal: Positive for back pain.      Allergies  Review of patient's allergies indicates no known allergies.  Home Medications   Prior to Admission medications   Medication Sig Start Date End Date Taking? Authorizing Provider  ibuprofen (ADVIL,MOTRIN) 600 MG tablet Take 1 tablet (600 mg total) by mouth every 6 (six) hours as needed. Patient not taking: Reported on 10/14/2014  09/09/13   Paul MaduraKelly Cole, Paul Cole  ibuprofen (ADVIL,MOTRIN) 800 MG tablet Take 1 tablet (800 mg total) by mouth 3 (three) times daily. Patient not taking: Reported on 10/14/2014 04/08/14   Paul BellmanHannah S Cole, Paul Cole  ondansetron (ZOFRAN) 4 MG tablet Take 1 tablet (4 mg total) by mouth every 6 (six) hours. Patient not taking: Reported on 10/14/2014 12/24/13   Paul Rasmussenavid Mabe, NP  traMADol (ULTRAM) 50 MG tablet Take 1 tablet (50 mg total) by mouth every 6 (six) hours as needed. Patient not taking: Reported on 10/14/2014 09/09/13   Paul MaduraKelly Cole, Paul Cole   BP 129/82 mmHg  Pulse 55  Temp(Src) 98.4 F (36.9 C) (Oral)  Resp 18  SpO2 100% Physical Exam  Constitutional: He is oriented to person, place, and time. He appears well-developed and well-nourished. No distress.  HENT:  Head: Normocephalic and atraumatic.  Mouth/Throat: Oropharynx is clear and moist. No oropharyngeal exudate.  Eyes: Pupils are equal, round, and reactive to light.  Neck: Neck supple.  Cardiovascular: Normal rate, regular rhythm and normal heart sounds.   No murmur heard. Pulmonary/Chest: Effort normal and breath sounds normal. No respiratory distress. He has no wheezes. He has no rales.  Abdominal: There is no tenderness.  Musculoskeletal: He exhibits no edema.  No midline spine TTP. Bilateral lumbar paraspinal tenderness.   Neurological: He is alert and oriented to person, place, and time. No cranial nerve deficit.  Lower extremity strength and sensation equal and intact, bilaterally.  Skin: Skin is warm and dry. No rash noted.  Psychiatric: He has a  normal mood and affect. His behavior is normal.  Nursing note and vitals reviewed.   ED Course  Procedures  DIAGNOSTIC STUDIES: Oxygen Saturation is 100% on room air, normal by my interpretation.    COORDINATION OF CARE: 10:33 AM-Discussed treatment plan which includes work note, muscle relaxer and pain medication with pt at bedside and pt agreed to plan.    Labs Review Labs  Reviewed - No data to display  Imaging Review No results found.   EKG Interpretation None      MDM   Final diagnoses:  Strain of lumbar paraspinal muscle, initial encounter    Patient likely has lumbar paraspinal muscle strain. No bony tenderness. No bladder/bowel incontinence or saddle paresthesias. Patient will have vicodin and flexeril for symptoms. Patient instructed to apply heating pad to affected area.   I personally performed the services described in this documentation, which was scribed in my presence. The recorded information has been reviewed and is accurate.    Paul BeckKaitlyn Paul Cole, Paul Cole 10/14/14 1204  Paul LennertJoseph L Zammit, MD 10/15/14 [phone]

## 2014-10-14 NOTE — Discharge Instructions (Signed)
Take Vicodin as needed for pain. Take Flexeril as needed for muscle spasm. Refer to attached documents for more information. Apply heat to your affected area. Return to the ED with worsening or concerning symptoms.

## 2014-12-10 ENCOUNTER — Emergency Department (HOSPITAL_COMMUNITY)
Admission: EM | Admit: 2014-12-10 | Discharge: 2014-12-10 | Disposition: A | Discharge status: Home / Self Care | Payer: Self-pay

## 2014-12-10 NOTE — ED Notes (Signed)
Pt states he is going to see his dr in the morning and he does not want to see anyone here

## 2015-01-29 ENCOUNTER — Emergency Department (INDEPENDENT_AMBULATORY_CARE_PROVIDER_SITE_OTHER)
Admission: EM | Admit: 2015-01-29 | Discharge: 2015-01-29 | Disposition: A | Discharge status: Home / Self Care | Payer: BLUE CROSS/BLUE SHIELD | Source: Home / Self Care | Attending: Emergency Medicine | Admitting: Emergency Medicine

## 2015-01-29 ENCOUNTER — Encounter (HOSPITAL_COMMUNITY): Payer: Self-pay | Admitting: *Deleted

## 2015-01-29 DIAGNOSIS — A059 Bacterial foodborne intoxication, unspecified: Secondary | ICD-10-CM

## 2015-01-29 MED ORDER — ONDANSETRON HCL 4 MG PO TABS
4.0000 mg | ORAL_TABLET | Freq: Three times a day (TID) | ORAL | Status: DC | PRN
Start: 2015-01-29 — End: 2017-02-21

## 2015-01-29 NOTE — ED Notes (Signed)
C/o nausea, vomiting and diarrhea.  States he can drink water but can't eat.  [facility] [employer] [place] in [place] and 2 hrs later he was vomiting.  Diarrhea started this AM.  Cramping off and on

## 2015-01-29 NOTE — Discharge Instructions (Signed)
You have food poisoning. Symptoms are usually the worst the first 24-48 hours. Use Zofran every 8 hours as needed for nausea and vomiting. Make sure you are drinking plenty of fluids. If you are worried you're getting dehydrated - dry mouth, dizzy when standing, not urinating - please come back.

## 2015-01-29 NOTE — ED Provider Notes (Addendum)
CSN: [id number]     Arrival date & time 01/29/15  1349 History   First MD Initiated Contact with Patient 01/29/15 1456     Chief Complaint  Patient presents with  . Nausea   (Consider location/radiation/quality/duration/timing/severity/associated sxs/prior Treatment) HPI  He is a 29 year old man here for evaluation of nausea, vomiting, diarrhea. He states he ate at [employer] last night and 2-3 hours later he started vomiting. No blood in the vomit. He reports some continued nausea, but the vomiting has resolved. This morning he started having nonbloody diarrhea associated with some abdominal cramps. No fevers. He is tolerating liquids and soup well. No dizziness or dry mouth.  History reviewed. No pertinent past medical history. History reviewed. No pertinent past surgical history. History reviewed. No pertinent family history. History  Substance Use Topics  . Smoking status: Never Smoker   . Smokeless tobacco: Not on file  . Alcohol Use: Yes     Comment: occasional    Review of Systems  Constitutional: Positive for appetite change. Negative for fever and chills.  Gastrointestinal: Positive for nausea, vomiting, abdominal pain and diarrhea. Negative for blood in stool.  Neurological: Negative for dizziness.    Allergies  Review of patient's allergies indicates no known allergies.  Home Medications   Prior to Admission medications   Medication Sig Start Date End Date Taking? Authorizing Provider  ondansetron (ZOFRAN) 4 MG tablet Take 1 tablet (4 mg total) by mouth every 8 (eight) hours as needed for nausea or vomiting. 01/29/15   Charm RingsErin J Kentrell Hallahan, MD   There were no vitals taken for this visit. Physical Exam  Constitutional: He is oriented to person, place, and time. He appears well-developed and well-nourished. No distress.  HENT:  Mouth/Throat: Oropharynx is clear and moist.  Cardiovascular: Normal rate, regular rhythm and normal heart sounds.   No murmur  heard. Pulmonary/Chest: Effort normal.  Abdominal: Soft. Bowel sounds are normal. He exhibits no distension. There is no tenderness. There is no rebound and no guarding.  Neurological: He is alert and oriented to person, place, and time.  Vitals reviewed.   ED Course  Procedures (including critical care time) Labs Review Labs Reviewed - No data to display  Imaging Review No results found.   MDM   1. Food poisoning    Symptomatic treatment with Zofran. Discussed expected time course. Reviewed signs and symptoms of dehydration. Follow-up as needed.    Charm RingsErin J Embrie Mikkelsen, MD 01/29/15 1515  Charm RingsErin J Arletta Lumadue, MD 01/29/15 [phone]

## 2015-02-01 ENCOUNTER — Emergency Department (HOSPITAL_COMMUNITY)
Admission: EM | Admit: 2015-02-01 | Discharge: 2015-02-01 | Disposition: A | Discharge status: Home / Self Care | Payer: BLUE CROSS/BLUE SHIELD | Attending: Emergency Medicine | Admitting: Emergency Medicine

## 2015-02-01 ENCOUNTER — Emergency Department (HOSPITAL_COMMUNITY): Payer: BLUE CROSS/BLUE SHIELD

## 2015-02-01 ENCOUNTER — Encounter (HOSPITAL_COMMUNITY): Payer: Self-pay | Admitting: *Deleted

## 2015-02-01 DIAGNOSIS — M25511 Pain in right shoulder: Secondary | ICD-10-CM

## 2015-02-01 DIAGNOSIS — R200 Anesthesia of skin: Secondary | ICD-10-CM | POA: Insufficient documentation

## 2015-02-01 MED ORDER — KETOROLAC TROMETHAMINE 60 MG/2ML IM SOLN
60.0000 mg | Freq: Once | INTRAMUSCULAR | Status: AC
Start: 2015-02-01 — End: 2015-02-01
  Administered 2015-02-01: 60 mg via INTRAMUSCULAR
  Filled 2015-02-01: qty 2

## 2015-02-01 MED ORDER — IBUPROFEN 800 MG PO TABS: 800.0000 mg | ORAL_TABLET | Freq: Three times a day (TID) | ORAL | Status: AC | PRN

## 2015-02-01 NOTE — ED Provider Notes (Signed)
CSN: [id number]     Arrival date & time 02/01/15  1228 History   First MD Initiated Contact with Patient 02/01/15 1238     Chief Complaint  Patient presents with  . Shoulder Pain     (Consider location/radiation/quality/duration/timing/severity/associated sxs/prior Treatment) HPI   Patient presents with right shoulder pain that has been ongoing for 5 months.  It has been gradually worsening over time.  Pain is diffuse, worse posteriorly, sharp in nature, worse with raising arm or attempting to open a door or put the car in drive.  Has had occasional numbness/tingling in his index and middle finger on his right hand.  Works at [employer] doing heavy lifting but denies specific injury.  Denies fevers, recent infections, other joint pain, weakness of the right arm.    History reviewed. No pertinent past medical history. History reviewed. No pertinent past surgical history. History reviewed. No pertinent family history. History  Substance Use Topics  . Smoking status: Never Smoker   . Smokeless tobacco: Not on file  . Alcohol Use: Yes     Comment: occasional    Review of Systems  Constitutional: Negative for fever and chills.  Respiratory: Negative for shortness of breath.   Cardiovascular: Negative for chest pain.  Musculoskeletal: Positive for arthralgias. Negative for back pain.  Skin: Negative for color change, rash and wound.  Allergic/Immunologic: Negative for immunocompromised state.  Neurological: Positive for numbness. Negative for weakness.  Hematological: Does not bruise/bleed easily.  Psychiatric/Behavioral: Negative for self-injury.      Allergies  Review of patient's allergies indicates no known allergies.  Home Medications   Prior to Admission medications   Medication Sig Start Date End Date Taking? Authorizing Provider  ibuprofen (ADVIL,MOTRIN) 800 MG tablet Take 1 tablet (800 mg total) by mouth every 8 (eight) hours as needed for mild pain or moderate pain. 02/01/15    [name], PA-C  ondansetron (ZOFRAN) 4 MG tablet Take 1 tablet (4 mg total) by mouth every 8 (eight) hours as needed for nausea or vomiting. 01/29/15   [name], MD   BP 104/67 mmHg  Pulse 72  Temp(Src) 98 F (36.7 C) (Oral)  Resp 18  Ht  (1.905 m)  Wt 185 lb (83.915 kg)  BMI 23.12 kg/m2  SpO2 97% Physical Exam  Constitutional: He appears well-developed and well-nourished. No distress.  HENT:  Head: Normocephalic and atraumatic.  Neck: Neck supple.  Pulmonary/Chest: Effort normal. He exhibits no tenderness.  Musculoskeletal:       Right shoulder: He exhibits decreased range of motion, tenderness and pain. He exhibits no swelling, no effusion, no crepitus, no deformity, no laceration, no spasm, normal pulse and normal strength.       Cervical back: He exhibits no tenderness and no bony tenderness.  Altered sensation over 2nd and 3rd digits of right hand.  5/5 string of grip, biceps, triceps.  Decreased ROM of right shoulder secondary to pain.  Pain with abduction, internal rotation.  Tender to palpation diffusely over shoulder.  No specific AC joint tenderness.  No erythema, edema, warmth.    Neurological: He is alert.  Skin: He is not diaphoretic.  Nursing note and vitals reviewed.   ED Course  Procedures (including critical care time) Labs Review Labs Reviewed - No data to display  Imaging Review Dg Shoulder Right  02/01/2015   CLINICAL DATA:  Right shoulder pain and swelling  EXAM: RIGHT SHOULDER - 2+ VIEW  COMPARISON:  None.  FINDINGS: There is no  evidence of fracture or dislocation. There is no evidence of arthropathy or other focal bone abnormality. Soft tissues are unremarkable.  IMPRESSION: Negative.   Electronically Signed   By: Christiana PellantGretchen  Green M.D.   On: 02/01/2015 13:36     EKG Interpretation None      MDM   Final diagnoses:  Right shoulder pain    Afebrile, nontoxic patient with pain x several months, gradually worsening in the right shoulder.   Heavy lifting and repetitive motion on job.  Tingling in medial nerve distribution in hand.  No weakness.  No e/o septic joint.   Xray negative.   D/C home with NSAIDs, sling (pt advised to use sparingly and do ROM exercises, orthopedic follow up.  Given on cal dr but also [facility] information as he has seen them before.  Discussed result, findings, treatment, and follow up  with patient.  Pt given return precautions.  Pt verbalizes understanding and agrees with plan.        Trixie Dredgemily Merline Perkin, PA-C 02/01/15 1354  Benjiman CoreNathan Pickering, MD 02/04/15 2209

## 2015-02-01 NOTE — ED Notes (Signed)
Pt reports heavy lifting at work, having right shoulder pain for a while but went to open a door yesterday and onset of severe right shoulder pain.

## 2015-02-01 NOTE — Discharge Instructions (Signed)
Read the information below.  Use the prescribed medication as directed.  Please discuss all new medications with your pharmacist.  You may return to the Emergency Department at any time for worsening condition or any new symptoms that concern you.    If you develop uncontrolled pain, weakness or numbness of the extremity, severe discoloration of the skin, or you are unable to move your arm, return to the ER for a recheck.    ° ° °Shoulder Pain °The shoulder is the joint that connects your arms to your body. The bones that form the shoulder joint include the upper arm bone (humerus), the shoulder blade (scapula), and the collarbone (clavicle). The top of the humerus is shaped like a ball and fits into a rather flat socket on the scapula (glenoid cavity). A combination of muscles and strong, fibrous tissues that connect muscles to bones (tendons) support your shoulder joint and hold the ball in the socket. Small, fluid-filled sacs (bursae) are located in different areas of the joint. They act as cushions between the bones and the overlying soft tissues and help reduce friction between the gliding tendons and the bone as you move your arm. Your shoulder joint allows a wide range of motion in your arm. This range of motion allows you to do things like scratch your back or throw a ball. However, this range of motion also makes your shoulder more prone to pain from overuse and injury. °Causes of shoulder pain can originate from both injury and overuse and usually can be grouped in the following four categories: °· Redness, swelling, and pain (inflammation) of the tendon (tendinitis) or the bursae (bursitis). °· Instability, such as a dislocation of the joint. °· Inflammation of the joint (arthritis). °· Broken bone (fracture). °HOME CARE INSTRUCTIONS  °· Apply ice to the sore area. °· Put ice in a plastic bag. °· Place a towel between your skin and the bag. °· Leave the ice on for 15-20 minutes, 3-4 times per day for the  first 2 days, or as directed by your health care provider. °· Stop using cold packs if they do not help with the pain. °· If you have a shoulder sling or immobilizer, wear it as long as your caregiver instructs. Only remove it to shower or bathe. Move your arm as little as possible, but keep your hand moving to prevent swelling. °· Squeeze a soft ball or foam pad as much as possible to help prevent swelling. °· Only take over-the-counter or prescription medicines for pain, discomfort, or fever as directed by your caregiver. °SEEK MEDICAL CARE IF:  °· Your shoulder pain increases, or new pain develops in your arm, hand, or fingers. °· Your hand or fingers become cold and numb. °· Your pain is not relieved with medicines. °SEEK IMMEDIATE MEDICAL CARE IF:  °· Your arm, hand, or fingers are numb or tingling. °· Your arm, hand, or fingers are significantly swollen or turn white or blue. °MAKE SURE YOU:  °· Understand these instructions. °· Will watch your condition. °· Will get help right away if you are not doing well or get worse. °Document Released: 07/27/2005 Document Revised: 03/03/2014 Document Reviewed: 10/01/2011 °ExitCare® Patient Information ©2015 [employer]. This information is not intended to replace advice given to you by your health care provider. Make sure you discuss any questions you have with your health care provider. ° °Shoulder Range of Motion Exercises °The shoulder is the most flexible joint in the human body. Because of this   it is also the most unstable joint in the body. All ages can develop shoulder problems. Early treatment of problems is necessary for a good outcome. People react to shoulder pain by decreasing the movement of the joint. After a brief period of time, the shoulder can become "frozen". This is an almost complete loss of the ability to move the damaged shoulder. Following injuries your caregivers can give you instructions on exercises to keep your range of motion (ability to  move your shoulder freely), or regain it if it has been lost.  °EXERCISES °EXERCISES TO MAINTAIN THE MOBILITY OF YOUR SHOULDER: °Codman's Exercise or Pendulum Exercise °· This exercise may be performed in a prone (face-down) lying position or standing while leaning on a chair with the opposite arm. Its purpose is to relax the muscles in your shoulder and slowly but surely increase the range of motion and to relieve pain. °¨ Lie on your stomach close to the side edge of the bed. Let your weak arm hang over the edge of the bed. Relax your shoulder, arm and hand. Let your shoulder blade relax and drop down. °¨ Slowly and gently swing your arm forward and back. Do not use your neck muscles; relax them. It might be easier to have someone else gently start swinging your arm. °¨ As pain decreases, increase your swing. To start, arm swing should begin at 15 degree angles. In time and as pain lessens, move to 30-45 degree angles. Start with swinging for about 15 seconds, and work towards swinging for 3 to 5 minutes. °· This exercise may also be performed in a standing/bent over position. °¨ Stand and hold onto a sturdy chair with your good arm. Bend forward at the waist and bend your knees slightly to help protect your back. Relax your weak arm, let it hang limp. Relax your shoulder blade and let it drop. °¨ Keep your shoulder relaxed and use body motion to swing your arm in small circles. °¨ Stand up tall and relax. °¨ Repeat motion and change direction of circles. °¨ Start with swinging for about 30 seconds, and work towards swinging for 3 to 5 minutes. °STRETCHING EXERCISES: °· Lift your arm out in front of you with the elbow bent at 90 degrees. Using your other arm gently pull the elbow forward and across your body. °· Bend one arm behind you with the palm facing outward. Using the other arm, hold a towel or rope and reach this arm up above your head, then bend it at the elbow to move your wrist to behind your neck. Grab  the free end of the towel with the hand behind your back. Gently pull the towel up with the hand behind your neck, gradually increasing the pull on the hand behind the small of your back. Then, gradually pull down with the hand behind the small of your back. This will pull the hand and arm behind your neck further. Both shoulders will have an increased range of motion with repetition of this exercise. °STRENGTHENING EXERCISES: °· Standing with your arm at your side and straight out from your shoulder with the elbow bent at 90 degrees, hold onto a small weight and slowly raise your hand so it points straight up in the air. Repeat this five times to begin with, and gradually increase to ten times. Do this four times per day. As you grow stronger you can gradually increase the weight. °· Repeat the above exercise, only this time using an elastic   band. Start with your hand up in the air and pull down until your hand is by your side. As you grow stronger, gradually increase the amount you pull by increasing the number or size of the elastic bands. Use the same amount of repetitions. °· Standing with your hand at your side and holding onto a weight, gradually lift the hand in front of you until it is over your head. Do the same also with the hand remaining at your side and lift the hand away from your body until it is again over your head. Repeat this five times to begin with, and gradually increase to ten times. Do this four times per day. As you grow stronger you can gradually increase the weight. °Document Released: 07/16/2003 Document Revised: 10/22/2013 Document Reviewed: 10/17/2005 °ExitCare® Patient Information ©2015 [employer]. This information is not intended to replace advice given to you by your health care provider. Make sure you discuss any questions you have with your health care provider. ° °

## 2015-02-05 ENCOUNTER — Ambulatory Visit
Admission: RE | Admit: 2015-02-05 | Discharge: 2015-02-05 | Disposition: A | Discharge status: Home / Self Care | Payer: BLUE CROSS/BLUE SHIELD | Source: Ambulatory Visit | Attending: Orthopedic Surgery | Admitting: Orthopedic Surgery

## 2015-02-05 ENCOUNTER — Other Ambulatory Visit: Payer: Self-pay | Admitting: Orthopedic Surgery

## 2015-02-05 DIAGNOSIS — M542 Cervicalgia: Secondary | ICD-10-CM

## 2015-02-17 ENCOUNTER — Other Ambulatory Visit: Payer: Self-pay | Admitting: Orthopedic Surgery

## 2015-02-17 DIAGNOSIS — M25511 Pain in right shoulder: Secondary | ICD-10-CM

## 2015-03-09 ENCOUNTER — Inpatient Hospital Stay: Admission: RE | Admit: 2015-03-09 | Payer: BLUE CROSS/BLUE SHIELD | Source: Ambulatory Visit

## 2015-03-09 ENCOUNTER — Other Ambulatory Visit: Payer: BLUE CROSS/BLUE SHIELD

## 2015-06-18 ENCOUNTER — Emergency Department (HOSPITAL_COMMUNITY)
Admission: EM | Admit: 2015-06-18 | Discharge: 2015-06-18 | Disposition: A | Discharge status: Home / Self Care | Payer: BLUE CROSS/BLUE SHIELD | Attending: Emergency Medicine | Admitting: Emergency Medicine

## 2015-06-18 ENCOUNTER — Encounter (HOSPITAL_COMMUNITY): Payer: Self-pay | Admitting: Emergency Medicine

## 2015-06-18 DIAGNOSIS — W228XXA Striking against or struck by other objects, initial encounter: Secondary | ICD-10-CM | POA: Insufficient documentation

## 2015-06-18 DIAGNOSIS — Y990 Civilian activity done for income or pay: Secondary | ICD-10-CM | POA: Insufficient documentation

## 2015-06-18 DIAGNOSIS — S0993XA Unspecified injury of face, initial encounter: Secondary | ICD-10-CM | POA: Diagnosis present

## 2015-06-18 DIAGNOSIS — Z23 Encounter for immunization: Secondary | ICD-10-CM | POA: Insufficient documentation

## 2015-06-18 DIAGNOSIS — S01111A Laceration without foreign body of right eyelid and periocular area, initial encounter: Secondary | ICD-10-CM

## 2015-06-18 DIAGNOSIS — Y9389 Activity, other specified: Secondary | ICD-10-CM | POA: Insufficient documentation

## 2015-06-18 DIAGNOSIS — Y9289 Other specified places as the place of occurrence of the external cause: Secondary | ICD-10-CM | POA: Diagnosis not present

## 2015-06-18 MED ORDER — TETANUS-DIPHTH-ACELL PERTUSSIS 5-2.5-18.5 LF-MCG/0.5 IM SUSP
0.5000 mL | Freq: Once | INTRAMUSCULAR | Status: AC
  Administered 2015-06-18: 0.5 mL via INTRAMUSCULAR
  Filled 2015-06-18: qty 0.5

## 2015-06-18 MED ORDER — LIDOCAINE-EPINEPHRINE 2 %-1:100000 IJ SOLN
1.7000 mL | Freq: Once | INTRAMUSCULAR | Status: AC
  Administered 2015-06-18: 1.7 mL
  Filled 2015-06-18: qty 1

## 2015-06-18 NOTE — ED Notes (Addendum)
Pt arrived to the ED with a complaint of a laceration to the head.  Pt states he bent down and hit his head on a piece of medal.  Pt tenus shot is not current. Pt has a small laceration on the right eyebrow.  Bleeding is controlled. Pt denies LOC

## 2015-06-18 NOTE — ED Provider Notes (Signed)
CSN: [id number]     Arrival date & time 06/18/15  2131 History  This chart was scribed for non-physician practitioner, Arman Filter, NP, working with Linwood Dibbles, MD, by Budd Palmer ED Scribe. This patient was seen in room WTR5/WTR5 and the patient's care was started at 10:27 PM    Chief Complaint  Patient presents with  . Head Injury   The history is provided by the patient. No language interpreter was used.   HPI Comments: Paul Cole is a 29 y.o. male who presents to the Emergency Department complaining of a laceration to the right eyebrow sustained 9 hours ago. He states that he bent down and hit his head on a piece of metal while at work. He does not recall his last tetanus shot.  History reviewed. No pertinent past medical history. History reviewed. No pertinent past surgical history. History reviewed. No pertinent family history. Social History  Substance Use Topics  . Smoking status: Never Smoker   . Smokeless tobacco: None  . Alcohol Use: Yes     Comment: occasional    Review of Systems  Eyes: Negative for visual disturbance.  Skin: Positive for wound.  Neurological: Negative for dizziness and headaches.  Psychiatric/Behavioral: Negative for confusion.    Allergies  Review of patient's allergies indicates no known allergies.  Home Medications   Prior to Admission medications   Medication Sig Start Date End Date Taking? Authorizing Provider  ibuprofen (ADVIL,MOTRIN) 800 MG tablet Take 1 tablet (800 mg total) by mouth every 8 (eight) hours as needed for mild pain or moderate pain. Patient not taking: Reported on 06/18/2015 02/01/15   [name], PA-C  ondansetron (ZOFRAN) 4 MG tablet Take 1 tablet (4 mg total) by mouth every 8 (eight) hours as needed for nausea or vomiting. Patient not taking: Reported on 06/18/2015 01/29/15   [name], MD   BP 120/77 mmHg  Pulse 54  Temp(Src) 98.2 F (36.8 C) (Oral)  Resp 20  SpO2 99% Physical Exam  Constitutional: He  appears well-developed and well-nourished.  HENT:  Head: Normocephalic.  Right Ear: External ear normal.  Left Ear: External ear normal.  Eyes: Pupils are equal, round, and reactive to light.  Neck: Normal range of motion.  Cardiovascular: Normal rate.   Musculoskeletal: Normal range of motion.  Neurological: He is alert.  Skin:  4 mm wide laceration to middle R eyebrow   Nursing note and vitals reviewed.   ED Course  Procedures  DIAGNOSTIC STUDIES: Oxygen Saturation is 99% on RA, normal by my interpretation.    COORDINATION OF CARE: 10:30 PM - Discussed plans to suture the wound and order a Tdap. Pt advised of plan for treatment and pt agrees.  LACERATION REPAIR PROCEDURE NOTE The patient's identification was confirmed and consent was obtained. This procedure was performed by Arman Filter, NP at 10:50 PM. Site: Right eyebrow Sterile procedures observed Anesthetic used (type and amt): 1.7 mL Xylocaine w/Epi Suture type/size: 6.0 Proline  Length: 3 mm # of Sutures: 2 Technique: Interrupted Complexity: Simple Antibx ointment applied Tetanus ordered Site anesthetized, irrigated with NS, explored without evidence of foreign body, wound well approximated, site covered with dry, sterile dressing.  Patient tolerated procedure well without complications. Instructions for care discussed verbally and patient provided with additional written instructions for homecare and f/u.  Labs Review Labs Reviewed - No data to display  Imaging Review No results found. I have personally reviewed and evaluated these images and lab results as part  of my medical decision-making.   EKG Interpretation None      MDM   Final diagnoses:  Eyebrow laceration, right, initial encounter    I personally performed the services described in this documentation, which was scribed in my presence. The recorded information has been reviewed and is accurate.  Earley Favor, NP 06/18/15 2300  Linwood Dibbles, MD 06/19/15 [phone]

## 2015-06-18 NOTE — Discharge Instructions (Signed)
Facial Laceration A facial laceration is a cut on the face. These injuries can be painful and cause bleeding. Some cuts may need to be closed with stitches (sutures), skin adhesive strips, or wound glue. Cuts usually heal quickly but can leave a scar. It can take 1-2 years for the scar to go away completely. HOME CARE   Only take medicines as told by your doctor.  Follow your doctor's instructions for wound care. For Stitches:  Keep the cut clean and dry.  If you have a bandage (dressing), change it at least once a day. Change the bandage if it gets wet or dirty, or as told by your doctor.  Wash the cut with soap and water 2 times a day. Rinse the cut with water. Pat it dry with a clean towel.  Put a thin layer of medicated cream on the cut as told by your doctor.  You may shower after the first 24 hours. Do not soak the cut in water until the stitches are removed.  Have your stitches removed as told by your doctor.  Do not wear any makeup until a few days after your stitches are removed. For Skin Adhesive Strips:  Keep the cut clean and dry.  Do not get the strips wet. You may take a bath, but be careful to keep the cut dry.  If the cut gets wet, pat it dry with a clean towel.  The strips will fall off on their own. Do not remove the strips that are still stuck to the cut. For Wound Glue:  You may shower or take baths. Do not soak or scrub the cut. Do not swim. Avoid heavy sweating until the glue falls off on its own. After a shower or bath, pat the cut dry with a clean towel.  Do not put medicine or makeup on your cut until the glue falls off.  If you have a bandage, do not put tape over the glue.  Avoid lots of sunlight or tanning lamps until the glue falls off.  The glue will fall off on its own in 5-10 days. Do not pick at the glue. After Healing: Put sunscreen on the cut for the first year to reduce your scar. GET HELP RIGHT AWAY IF:   Your cut area gets red,  painful, or puffy (swollen).  You see a yellowish-white fluid (pus) coming from the cut.  You have chills or a fever. MAKE SURE YOU:   Understand these instructions.  Will watch your condition.  Will get help right away if you are not doing well or get worse. Document Released: 04/04/2008 Document Revised: 08/07/2013 Document Reviewed: 05/30/2013 [facility] Patient Information 2015 [place], [place]. This information is not intended to replace advice given to you by your health care provider. Make sure you discuss any questions you have with your health care provider. You have 2 sutures that will need to be removed in 5 days you can go to the work clinic, Urgent Care or your PCP

## 2015-09-30 ENCOUNTER — Emergency Department (HOSPITAL_COMMUNITY)
Admission: EM | Admit: 2015-09-30 | Discharge: 2015-09-30 | Disposition: A | Discharge status: Home / Self Care | Payer: BLUE CROSS/BLUE SHIELD | Attending: Emergency Medicine | Admitting: Emergency Medicine

## 2015-09-30 ENCOUNTER — Encounter (HOSPITAL_COMMUNITY): Payer: Self-pay | Admitting: Emergency Medicine

## 2015-09-30 ENCOUNTER — Emergency Department (HOSPITAL_COMMUNITY): Payer: BLUE CROSS/BLUE SHIELD

## 2015-09-30 DIAGNOSIS — S9031XA Contusion of right foot, initial encounter: Secondary | ICD-10-CM | POA: Diagnosis not present

## 2015-09-30 DIAGNOSIS — Y9389 Activity, other specified: Secondary | ICD-10-CM | POA: Diagnosis not present

## 2015-09-30 DIAGNOSIS — W208XXA Other cause of strike by thrown, projected or falling object, initial encounter: Secondary | ICD-10-CM | POA: Diagnosis not present

## 2015-09-30 DIAGNOSIS — Y9289 Other specified places as the place of occurrence of the external cause: Secondary | ICD-10-CM | POA: Diagnosis not present

## 2015-09-30 DIAGNOSIS — Y998 Other external cause status: Secondary | ICD-10-CM | POA: Insufficient documentation

## 2015-09-30 DIAGNOSIS — S99921A Unspecified injury of right foot, initial encounter: Secondary | ICD-10-CM | POA: Diagnosis present

## 2015-09-30 MED ORDER — NAPROXEN 500 MG PO TABS: 500.0000 mg | ORAL_TABLET | Freq: Two times a day (BID) | ORAL | Status: DC

## 2015-09-30 NOTE — Discharge Instructions (Signed)
Foot Contusion °A foot contusion is a deep bruise to the foot. Contusions are the result of an injury that caused bleeding under the skin. The contusion may turn blue, purple, or yellow. Minor injuries will give you a painless contusion, but more severe contusions may stay painful and swollen for a few weeks. °CAUSES  °A foot contusion comes from a direct blow to that area, such as a heavy object falling on the foot. °SYMPTOMS  °· Swelling of the foot. °· Discoloration of the foot. °· Tenderness or soreness of the foot. °DIAGNOSIS  °You will have a physical exam and will be asked about your history. You may need an X-ray of your foot to look for a broken bone (fracture).  °TREATMENT  °An elastic wrap may be recommended to support your foot. Resting, elevating, and applying cold compresses to your foot are often the best treatments for a foot contusion. Over-the-counter medicines may also be recommended for pain control. °HOME CARE INSTRUCTIONS  °· Put ice on the injured area. °· Put ice in a plastic bag. °· Place a towel between your skin and the bag. °· Leave the ice on for 15-20 minutes, 03-04 times a day. °· Only take over-the-counter or prescription medicines for pain, discomfort, or fever as directed by your caregiver. °· If told, use an elastic wrap as directed. This can help reduce swelling. You may remove the wrap for sleeping, showering, and bathing. If your toes become numb, cold, or blue, take the wrap off and reapply it more loosely. °· Elevate your foot with pillows to reduce swelling. °· Try to avoid standing or walking while the foot is painful. Do not resume use until instructed by your caregiver. Then, begin use gradually. If pain develops, decrease use. Gradually increase activities that do not cause discomfort until you have normal use of your foot. °· See your caregiver as directed. It is very important to keep all follow-up appointments in order to avoid any lasting problems with your foot,  including long-term (chronic) pain. °SEEK IMMEDIATE MEDICAL CARE IF:  °· You have increased redness, swelling, or pain in your foot. °· Your swelling or pain is not relieved with medicines. °· You have loss of feeling in your foot or are unable to move your toes. °· Your foot turns cold or blue. °· You have pain when you move your toes. °· Your foot becomes warm to the touch. °· Your contusion does not improve in 2 days. °MAKE SURE YOU:  °· Understand these instructions. °· Will watch your condition. °· Will get help right away if you are not doing well or get worse. °  °This information is not intended to replace advice given to you by your health care provider. Make sure you discuss any questions you have with your health care provider. °  °Document Released: 08/08/2006 Document Revised: 04/17/2012 Document Reviewed: 06/23/2015 °Elsevier Interactive Patient Education ©2016 [employer] ° °Cryotherapy °Cryotherapy means treatment with cold. Ice or gel packs can be used to reduce both pain and swelling. Ice is the most helpful within the first 24 to 48 hours after an injury or flare-up from overusing a muscle or joint. Sprains, strains, spasms, burning pain, shooting pain, and aches can all be eased with ice. Ice can also be used when recovering from surgery. Ice is effective, has very few side effects, and is safe for most people to use. °PRECAUTIONS  °Ice is not a safe treatment option for people with: °· Raynaud phenomenon. This   is a condition affecting small blood vessels in the extremities. Exposure to cold may cause your problems to return. °· Cold hypersensitivity. There are many forms of cold hypersensitivity, including: °¨ Cold urticaria. Red, itchy hives appear on the skin when the tissues begin to warm after being iced. °¨ Cold erythema. This is a red, itchy rash caused by exposure to cold. °¨ Cold hemoglobinuria. Red blood cells break down when the tissues begin to warm after being iced. The hemoglobin  that carry oxygen are passed into the urine because they cannot combine with blood proteins fast enough. °· Numbness or altered sensitivity in the area being iced. °If you have any of the following conditions, do not use ice until you have discussed cryotherapy with your caregiver: °· Heart conditions, such as arrhythmia, angina, or chronic heart disease. °· High blood pressure. °· Healing wounds or open skin in the area being iced. °· Current infections. °· Rheumatoid arthritis. °· Poor circulation. °· Diabetes. °Ice slows the blood flow in the region it is applied. This is beneficial when trying to stop inflamed tissues from spreading irritating chemicals to surrounding tissues. However, if you expose your skin to cold temperatures for too long or without the proper protection, you can damage your skin or nerves. Watch for signs of skin damage due to cold. °HOME CARE INSTRUCTIONS °Follow these tips to use ice and cold packs safely. °· Place a dry or damp towel between the ice and skin. A damp towel will cool the skin more quickly, so you may need to shorten the time that the ice is used. °· For a more rapid response, add gentle compression to the ice. °· Ice for no more than 10 to 20 minutes at a time. The bonier the area you are icing, the less time it will take to get the benefits of ice. °· Check your skin after 5 minutes to make sure there are no signs of a poor response to cold or skin damage. °· Rest 20 minutes or more between uses. °· Once your skin is numb, you can end your treatment. You can test numbness by very lightly touching your skin. The touch should be so light that you do not see the skin dimple from the pressure of your fingertip. When using ice, most people will feel these normal sensations in this order: cold, burning, aching, and numbness. °· Do not use ice on someone who cannot communicate their responses to pain, such as small children or people with dementia. °HOW TO MAKE AN ICE PACK °Ice  packs are the most common way to use ice therapy. Other methods include ice massage, ice baths, and cryosprays. Muscle creams that cause a cold, tingly feeling do not offer the same benefits that ice offers and should not be used as a substitute unless recommended by your caregiver. °To make an ice pack, do one of the following: °· Place crushed ice or a bag of frozen vegetables in a sealable plastic bag. Squeeze out the excess air. Place this bag inside another plastic bag. Slide the bag into a pillowcase or place a damp towel between your skin and the bag. °· Mix 3 parts water with 1 part rubbing alcohol. Freeze the mixture in a sealable plastic bag. When you remove the mixture from the freezer, it will be slushy. Squeeze out the excess air. Place this bag inside another plastic bag. Slide the bag into a pillowcase or place a damp towel between your skin and the bag. °  SEEK MEDICAL CARE IF: °· You develop white spots on your skin. This may give the skin a blotchy (mottled) appearance. °· Your skin turns blue or pale. °· Your skin becomes waxy or hard. °· Your swelling gets worse. °MAKE SURE YOU:  °· Understand these instructions. °· Will watch your condition. °· Will get help right away if you are not doing well or get worse. °  °This information is not intended to replace advice given to you by your health care provider. Make sure you discuss any questions you have with your health care provider. °  °Document Released: 06/13/2011 Document Revised: 11/07/2014 Document Reviewed: 06/13/2011 °Elsevier Interactive Patient Education ©2016 [employer] ° °

## 2015-09-30 NOTE — ED Notes (Signed)
Pt states that he dropped something on his R foot today. Feels swollen and painful. Alert and oriented.

## 2015-09-30 NOTE — ED Provider Notes (Signed)
CSN: [id number]     Arrival date & time 09/30/15  2015 History   By signing my name below, I, Paul Cole, attest that this documentation has been prepared under the direction and in the presence of Milus Fritze PA-C.  Electronically Signed: Arlan OrganAshley Cole, ED Scribe. 09/30/2015. 9:20 PM.   Chief Complaint  Patient presents with  . Foot Pain   The history is provided by the patient. No language interpreter was used.    HPI Comments: Paul Cole is a 29 y.o. male without any pertinent past medical history who presents to the Emergency Department complaining of constant, ongoing R foot pain with associated mild swelling onset earlier today. Pt states he accidentally dropped a 80 pound weight on his foot from mid height directly on his foot. Pain is made worse with deep palpation, weight bearing, and ambulation. No alleviating factors at this time. No OTC medications or home remedies attempted prior to arrival. No recent fever or chills. No weakness, loss of sensation, or numbness.  PCP: No PCP Per Patient    History reviewed. No pertinent past medical history. History reviewed. No pertinent past surgical history. History reviewed. No pertinent family history. Social History  Substance Use Topics  . Smoking status: Never Smoker   . Smokeless tobacco: None  . Alcohol Use: Yes     Comment: occasional    Review of Systems  Constitutional: Negative for fever and chills.  Musculoskeletal: Positive for joint swelling and arthralgias.  Neurological: Negative for weakness and numbness.      Allergies  Review of patient's allergies indicates no known allergies.  Home Medications   Prior to Admission medications   Medication Sig Start Date End Date Taking? Authorizing Provider  nitroGLYCERIN (NITRODUR - DOSED IN MG/24 HR) 0.2 mg/hr patch APPLY 1/4 PATCH TO AFFECTED AREA EOD 07/27/15  Yes Historical Provider, MD  diclofenac (VOLTAREN) 75 MG EC tablet TAKE 1 TABLET TWICE DAILY WITH  FOOD X 2 WEEKS, 1 TABLET DAILY X 2 WEEKS, THEN ONCE DAILY AS NEEDED 07/27/15   Historical Provider, MD  ibuprofen (ADVIL,MOTRIN) 800 MG tablet Take 1 tablet (800 mg total) by mouth every 8 (eight) hours as needed for mild pain or moderate pain. Patient not taking: Reported on 06/18/2015 02/01/15   Trixie DredgeEmily West, PA-C  ondansetron (ZOFRAN) 4 MG tablet Take 1 tablet (4 mg total) by mouth every 8 (eight) hours as needed for nausea or vomiting. Patient not taking: Reported on 06/18/2015 01/29/15   Charm RingsErin J Honig, MD   Triage Vitals: BP 134/85 mmHg  Pulse 59  Temp(Src) 97.6 F (36.4 C) (Oral)  Resp 18  SpO2 100%   Physical Exam  Constitutional: He is oriented to person, place, and time. He appears well-developed and well-nourished.  HENT:  Head: Normocephalic.  Eyes: EOM are normal.  Neck: Normal range of motion.  Pulmonary/Chest: Effort normal.  Abdominal: He exhibits no distension.  Musculoskeletal: Normal range of motion. He exhibits tenderness. He exhibits no edema.  R foot has no swelling, discoloration, or bony deformity  Tenderness to palpation over proximal and dorsal foot FROM with full strength on plantar and dorsiflexion  Ankle non-tender   Neurological: He is alert and oriented to person, place, and time.  Psychiatric: He has a normal mood and affect.  Nursing note and vitals reviewed.   ED Course  Procedures (including critical care time)  DIAGNOSTIC STUDIES: Oxygen Saturation is 100% on RA, Normal by my interpretation.    COORDINATION OF CARE: 9:15 PM- Will  order DG foot complete R. Discussed treatment plan with pt at bedside and pt agreed to plan.     Labs Review Labs Reviewed - No data to display  Imaging Review No results found. I have personally reviewed and evaluated these images and lab results as part of my medical decision-making.   EKG Interpretation None      MDM   Final diagnoses:  None    1. Right foot contusion  No bony injury. He has preserved  ROM and strength of the foot. Recommend anti-inflammatory medications and cool compresses.   I personally performed the services described in this documentation, which was scribed in my presence. The recorded information has been reviewed and is accurate.     Elpidio Anis, PA-C 09/30/15 2144  Zadie Rhine, MD 10/01/15 [phone]

## 2015-11-25 ENCOUNTER — Emergency Department (HOSPITAL_COMMUNITY): Payer: BLUE CROSS/BLUE SHIELD

## 2015-11-25 ENCOUNTER — Emergency Department (HOSPITAL_COMMUNITY)
Admission: EM | Admit: 2015-11-25 | Discharge: 2015-11-25 | Disposition: A | Discharge status: Home / Self Care | Payer: BLUE CROSS/BLUE SHIELD | Attending: Emergency Medicine | Admitting: Emergency Medicine

## 2015-11-25 ENCOUNTER — Encounter (HOSPITAL_COMMUNITY): Payer: Self-pay | Admitting: Neurology

## 2015-11-25 DIAGNOSIS — Z791 Long term (current) use of non-steroidal anti-inflammatories (NSAID): Secondary | ICD-10-CM | POA: Diagnosis not present

## 2015-11-25 DIAGNOSIS — M25532 Pain in left wrist: Secondary | ICD-10-CM | POA: Diagnosis not present

## 2015-11-25 NOTE — ED Provider Notes (Signed)
CSN: [id number]     Arrival date & time 11/25/15  1023 History  By signing my name below, I, Murriel Hopper, attest that this documentation has been prepared under the direction and in the presence of [employer], PA-C.  Electronically Signed: Murriel Hopper, ED Scribe. 11/25/2015. 12:05 PM.    Chief Complaint  Patient presents with  . Wrist Pain      Patient is a 30 y.o. male presenting with wrist pain. The history is provided by the patient. No language interpreter was used.  Wrist Pain   HPI Comments: Paul Cole is a 30 y.o. male who presents to the Emergency Department complaining of constant, worsening left wrist pain that worsens with movement that has been present for about a month. Pt reports he has not seen a provider for his wrist pain prior to coming to ED today. 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 Pt also reports mild associated intermittent swelling but states there is no swelling currently.  Loss of distal sensation strength or motor function., Denies trauma.    History reviewed. No pertinent past medical history. History reviewed. No pertinent past surgical history. No family history on file. Social History  Substance Use Topics  . Smoking status: Never Smoker   . Smokeless tobacco: None  . Alcohol Use: Yes     Comment: occasional    Review of Systems  A complete 10 system review of systems was obtained and all systems are negative except as noted in the HPI and PMH.    Allergies  Review of patient's allergies indicates no known allergies.  Home Medications   Prior to Admission medications   Medication Sig Start Date End Date Taking? Authorizing Provider  diclofenac (VOLTAREN) 75 MG EC tablet TAKE 1 TABLET TWICE DAILY WITH FOOD X 2 WEEKS, 1 TABLET DAILY X 2 WEEKS, THEN ONCE DAILY AS NEEDED 07/27/15   Historical Provider, MD   ibuprofen (ADVIL,MOTRIN) 800 MG tablet Take 1 tablet (800 mg total) by mouth every 8 (eight) hours as needed for mild pain or moderate pain. Patient not taking: Reported on 06/18/2015 02/01/15   [name], PA-C  naproxen (NAPROSYN) 500 MG tablet Take 1 tablet (500 mg total) by mouth 2 (two) times daily. 09/30/15   Elpidio Anis, PA-C  nitroGLYCERIN (NITRODUR - DOSED IN MG/24 HR) 0.2 mg/hr patch APPLY 1/4 PATCH TO AFFECTED AREA EOD 07/27/15   Historical Provider, MD  ondansetron (ZOFRAN) 4 MG tablet Take 1 tablet (4 mg total) by mouth every 8 (eight) hours as needed for nausea or vomiting. Patient not taking: Reported on 06/18/2015 01/29/15   [name], MD   BP 138/82 mmHg  Pulse 64  Temp(Src) 98.6 F (37 C)  Resp 16  SpO2 100% Physical Exam  Constitutional: He is oriented to person, place, and time. He appears well-developed and well-nourished.  HENT:  Head: Normocephalic and atraumatic.  Cardiovascular: Normal rate.   Pulmonary/Chest: Effort normal.  Abdominal: He exhibits no distension.  Musculoskeletal:  Left wrist pain with flexion and extension Sensation strength and function intact Tenderness to palpation of distal radius and ulna Grip strength 5/5 Radial pulse 2+   Neurological: He is alert and oriented to person, place, and time.  Skin: Skin is warm and dry.  Psychiatric: He has a normal mood and affect.  Nursing note and vitals reviewed.  ED Course  Procedures (including critical care time)  DIAGNOSTIC STUDIES: Oxygen Saturation is 100% on room air, normal by my interpretation.    COORDINATION OF CARE: 12:05 PM Discussed treatment plan with pt at bedside and pt agreed to plan.   Labs Review Labs Reviewed - No data to display  Imaging Review Dg Wrist Complete Left  11/25/2015  CLINICAL DATA:  One month history of left wrist pain. Works for [employer] and is constantly lifting. EXAM: LEFT WRIST - COMPLETE 3+ VIEW COMPARISON:  None. FINDINGS: The joint spaces are  maintained. No degenerative changes or fracture. IMPRESSION: Normal left wrist radiographs. Electronically Signed   By: Rudie Meyer M.D.   On: 11/25/2015 11:19   I have personally reviewed and evaluated these images and lab results as part of my medical decision-making.   EKG Interpretation None      MDM   Final diagnoses:  Left wrist pain    Labs:  Imaging:  Consults:  Therapeutics:  Discharge Meds:   Assessment/Plan: Patient's presentation is most consistent with overuse wrist pain. He has no swelling, signs of infection negative x-ray films. Patient has no distal neurological dysfunction, strength function. Reduced capillary refill. Patient instructed to wear a wrist brace, ice, ibuprofen, follow-up with his orthopedist Dr. August Saucer for further evaluation and management.      Eyvonne Mechanic, PA-C 11/25/15 1241  Benjiman Core, MD 11/26/15 [phone]

## 2015-11-25 NOTE — ED Notes (Signed)
Pt reports left wrist pain for 1 month. Denies injury but has been having intermittent swelling. None noted now.

## 2015-11-25 NOTE — Discharge Instructions (Signed)

## 2016-02-16 ENCOUNTER — Encounter (HOSPITAL_COMMUNITY): Payer: Self-pay | Admitting: Family Medicine

## 2016-02-16 ENCOUNTER — Emergency Department (HOSPITAL_COMMUNITY)
Admission: EM | Admit: 2016-02-16 | Discharge: 2016-02-16 | Disposition: A | Discharge status: Home / Self Care | Payer: BLUE CROSS/BLUE SHIELD | Attending: Emergency Medicine | Admitting: Emergency Medicine

## 2016-02-16 DIAGNOSIS — S3992XA Unspecified injury of lower back, initial encounter: Secondary | ICD-10-CM | POA: Diagnosis present

## 2016-02-16 DIAGNOSIS — S39012A Strain of muscle, fascia and tendon of lower back, initial encounter: Secondary | ICD-10-CM | POA: Insufficient documentation

## 2016-02-16 DIAGNOSIS — Y9389 Activity, other specified: Secondary | ICD-10-CM | POA: Insufficient documentation

## 2016-02-16 DIAGNOSIS — Y9289 Other specified places as the place of occurrence of the external cause: Secondary | ICD-10-CM | POA: Diagnosis not present

## 2016-02-16 DIAGNOSIS — X58XXXA Exposure to other specified factors, initial encounter: Secondary | ICD-10-CM | POA: Insufficient documentation

## 2016-02-16 DIAGNOSIS — Y998 Other external cause status: Secondary | ICD-10-CM | POA: Insufficient documentation

## 2016-02-16 MED ORDER — NAPROXEN 500 MG PO TABS: 500.0000 mg | ORAL_TABLET | Freq: Two times a day (BID) | ORAL | Status: DC

## 2016-02-16 MED ORDER — METHOCARBAMOL 500 MG PO TABS: 500.0000 mg | ORAL_TABLET | Freq: Two times a day (BID) | ORAL | Status: DC

## 2016-02-16 NOTE — Discharge Instructions (Signed)
Low Back Strain With Rehab A strain is an injury in which a tendon or muscle is torn. The muscles and tendons of the lower back are vulnerable to strains. However, these muscles and tendons are very strong and require a great force to be injured. Strains are classified into three categories. Grade 1 strains cause pain, but the tendon is not lengthened. Grade 2 strains include a lengthened ligament, due to the ligament being stretched or partially ruptured. With grade 2 strains there is still function, although the function may be decreased. Grade 3 strains involve a complete tear of the tendon or muscle, and function is usually impaired. SYMPTOMS   Pain in the lower back.  Pain that affects one side more than the other.  Pain that gets worse with movement and may be felt in the hip, buttocks, or back of the thigh.  Muscle spasms of the muscles in the back.  Swelling along the muscles of the back.  Loss of strength of the back muscles.  Crackling sound (crepitation) when the muscles are touched. CAUSES  Lower back strains occur when a force is placed on the muscles or tendons that is greater than they can handle. Common causes of injury include:  Prolonged overuse of the muscle-tendon units in the lower back, usually from incorrect posture.  A single violent injury or force applied to the back. RISK INCREASES WITH:  Sports that involve twisting forces on the spine or a lot of bending at the waist (football, rugby, weightlifting, bowling, golf, tennis, speed skating, racquetball, swimming, running, gymnastics, diving).  Poor strength and flexibility.  Failure to warm up properly before activity.  Family history of lower back pain or disk disorders.  Previous back injury or surgery (especially fusion).  Poor posture with lifting, especially heavy objects.  Prolonged sitting, especially with poor posture. PREVENTION   Learn and use proper posture when sitting or lifting (maintain  proper posture when sitting, lift using the knees and legs, not at the waist).  Warm up and stretch properly before activity.  Allow for adequate recovery between workouts.  Maintain physical fitness:  Strength, flexibility, and endurance.  Cardiovascular fitness. PROGNOSIS  If treated properly, lower back strains usually heal within 6 weeks. RELATED COMPLICATIONS   Recurring symptoms, resulting in a chronic problem.  Chronic inflammation, scarring, and partial muscle-tendon tear.  Delayed healing or resolution of symptoms.  Prolonged disability. TREATMENT  Treatment first involves the use of ice and medicine, to reduce pain and inflammation. The use of strengthening and stretching exercises may help reduce pain with activity. These exercises may be performed at home or with a therapist. Severe injuries may require referral to a therapist for further evaluation and treatment, such as ultrasound. Your caregiver may advise that you wear a back brace or corset, to help reduce pain and discomfort. Often, prolonged bed rest results in greater harm then benefit. Corticosteroid injections may be recommended. However, these should be reserved for the most serious cases. It is important to avoid using your back when lifting objects. At night, sleep on your back on a firm mattress with a pillow placed under your knees. If non-surgical treatment is unsuccessful, surgery may be needed.  MEDICATION   If pain medicine is needed, nonsteroidal anti-inflammatory medicines (aspirin and ibuprofen), or other minor pain relievers (acetaminophen), are often advised.  Do not take pain medicine for 7 days before surgery.  Prescription pain relievers may be given, if your caregiver thinks they are needed. Use only as  directed and only as much as you need.  Ointments applied to the skin may be helpful.  Corticosteroid injections may be given by your caregiver. These injections should be reserved for the most  serious cases, because they may only be given a certain number of times. HEAT AND COLD  Cold treatment (icing) should be applied for 10 to 15 minutes every 2 to 3 hours for inflammation and pain, and immediately after activity that aggravates your symptoms. Use ice packs or an ice massage.  Heat treatment may be used before performing stretching and strengthening activities prescribed by your caregiver, physical therapist, or athletic trainer. Use a heat pack or a warm water soak. SEEK MEDICAL CARE IF:   Symptoms get worse or do not improve in 2 to 4 weeks, despite treatment.  You develop numbness, weakness, or loss of bowel or bladder function.  New, unexplained symptoms develop. (Drugs used in treatment may produce side effects.)  Back Injury Prevention Back injuries can be very painful. They can also be difficult to heal. After having one back injury, you are more likely to injure your back again. It is important to learn how to avoid injuring or re-injuring your back. The following tips can help you to prevent a back injury. WHAT SHOULD I KNOW ABOUT PHYSICAL FITNESS?  Exercise for 30 minutes per day on most days of the week or as told by your doctor. Make sure to:  Do aerobic exercises, such as walking, jogging, biking, or swimming.  Do exercises that increase balance and strength, such as tai chi and yoga.  Do stretching exercises. This helps with flexibility.  Try to develop strong belly (abdominal) muscles. Your belly muscles help to support your back.  Stay at a healthy weight. This helps to decrease your risk of a back injury. WHAT SHOULD I KNOW ABOUT MY DIET?  Talk with your doctor about your overall diet. Take supplements and vitamins only as told by your doctor.  Talk with your doctor about how much calcium and vitamin D you need each day. These nutrients help to prevent weakening of the bones (osteoporosis).  Include good sources of calcium in your diet, such  as:  Dairy products.  Green leafy vegetables.  Products that have had calcium added to them (fortified).  Include good sources of vitamin D in your diet, such as:  Milk.  Foods that have had vitamin D added to them. WHAT SHOULD I KNOW ABOUT MY POSTURE?  Sit up straight and stand up straight. Avoid leaning forward when you sit or hunching over when you stand.  Choose chairs that have good low-back (lumbar) support.  If you work at a desk, sit close to it so you do not need to lean over. Keep your chin tucked in. Keep your neck drawn back. Keep your elbows bent so your arms look like the letter "L" (right angle).  Sit high and close to the steering wheel when you drive. Add a low-back support to your car seat, if needed.  Avoid sitting or standing in one position for very long. Take breaks to get up, stretch, and walk around at least one time every hour. Take breaks every hour if you are driving for long periods of time.  Sleep on your side with your knees slightly bent, or sleep on your back with a pillow under your knees. Do not lie on the front of your body to sleep. WHAT SHOULD I KNOW ABOUT LIFTING, TWISTING, AND REACHING Lifting and Heavy  Lifting  Avoid heavy lifting, especially lifting over and over again. If you must do heavy lifting:  Stretch before lifting.  Work slowly.  Rest between lifts.  Use a tool such as a cart or a dolly to move objects if one is available.  Make several small trips instead of carrying one heavy load.  Ask for help when you need it, especially when moving big objects.  Follow these steps when lifting:  Stand with your feet shoulder-width apart.  Get as close to the object as you can. Do not pick up a heavy object that is far from your body.  Use handles or lifting straps if they are available.  Bend at your knees. Squat down, but keep your heels off the floor.  Keep your shoulders back. Keep your chin tucked in. Keep your back  straight.  Lift the object slowly while you tighten the muscles in your legs, belly, and butt. Keep the object as close to the center of your body as possible.  Follow these steps when putting down a heavy load:  Stand with your feet shoulder-width apart.  Lower the object slowly while you tighten the muscles in your legs, belly, and butt. Keep the object as close to the center of your body as possible.  Keep your shoulders back. Keep your chin tucked in. Keep your back straight.  Bend at your knees. Squat down, but keep your heels off the floor.  Use handles or lifting straps if they are available. Twisting and Reaching  Avoid lifting heavy objects above your waist.  Do not twist at your waist while you are lifting or carrying a load. If you need to turn, move your feet.  Do not bend over without bending at your knees.  Avoid reaching over your head, across a table, or for an object on a high surface.  WHAT ARE SOME OTHER TIPS?  Avoid wet floors and icy ground. Keep sidewalks clear of ice to prevent falls.   Do not sleep on a mattress that is too soft or too hard.   Keep items that you use often within easy reach.   Put heavier objects on shelves at waist level, and put lighter objects on lower or higher shelves.  Find ways to lower your stress, such as:  Exercise.  Massage.  Relaxation techniques.  Talk with your doctor if you feel anxious or depressed. These conditions can make back pain worse.  Wear flat heel shoes with cushioned soles.  Avoid making quick (sudden) movements.  Use both shoulder straps when carrying a backpack.  Do not use any tobacco products, including cigarettes, chewing tobacco, or electronic cigarettes. If you need help quitting, ask your doctor.   This information is not intended to replace advice given to you by your health care provider. Make sure you discuss any questions you have with your health care provider.   Document  Released: 04/04/2008 Document Revised: 03/03/2015 Document Reviewed: 10/21/2014 Elsevier Interactive Patient Education [employer].

## 2016-02-16 NOTE — ED Provider Notes (Signed)
CSN: [id number]     Arrival date & time 02/16/16  0123 History   First MD Initiated Contact with Patient 02/16/16 [phone]     Chief Complaint  Patient presents with  . Back Pain     (Consider location/radiation/quality/duration/timing/severity/associated sxs/prior Treatment) HPI Comments: 30 year old male with no significant past medical history presents to the emergency department for evaluation of right low back pain. Symptoms have been intermittent 1-2 weeks. He reports gradual worsening of his back pain. He has taken ibuprofen with minimal relief. He describes the pain is aching and throbbing. Pain is nonradiating. He attributes his worsening back pain to his job as he is a [employer] worker and does frequent heavy lifting. He has had no fever or incontinence. He denies history of cancer or IV drug use. Patient has had some sporadic left upper back pain as well, but denies this currently. No associated extremity numbness or weakness.  Patient is a 30 y.o. male presenting with back pain. The history is provided by the patient. No language interpreter was used.  Back Pain Associated symptoms: no fever and no weakness     History reviewed. No pertinent past medical history. History reviewed. No pertinent past surgical history. History reviewed. No pertinent family history. Social History  Substance Use Topics  . Smoking status: Never Smoker   . Smokeless tobacco: None  . Alcohol Use: Yes     Comment: occasional    Review of Systems  Constitutional: Negative for fever.  Genitourinary:       Negative for incontinence  Musculoskeletal: Positive for back pain.  Neurological: Negative for weakness.  All other systems reviewed and are negative.   Allergies  Review of patient's allergies indicates no known allergies.  Home Medications   Prior to Admission medications   Medication Sig Start Date End Date Taking? Authorizing Provider  diclofenac (VOLTAREN) 75 MG EC tablet TAKE 1 TABLET  TWICE DAILY WITH FOOD X 2 WEEKS, 1 TABLET DAILY X 2 WEEKS, THEN ONCE DAILY AS NEEDED 07/27/15   Historical Provider, MD  ibuprofen (ADVIL,MOTRIN) 800 MG tablet Take 1 tablet (800 mg total) by mouth every 8 (eight) hours as needed for mild pain or moderate pain. Patient not taking: Reported on 06/18/2015 02/01/15   Trixie DredgeEmily West, PA-C  methocarbamol (ROBAXIN) 500 MG tablet Take 1 tablet (500 mg total) by mouth 2 (two) times daily. 02/16/16   Antony MaduraKelly Woods Gangemi, PA-C  naproxen (NAPROSYN) 500 MG tablet Take 1 tablet (500 mg total) by mouth 2 (two) times daily. 02/16/16   Antony MaduraKelly Lash Matulich, PA-C  nitroGLYCERIN (NITRODUR - DOSED IN MG/24 HR) 0.2 mg/hr patch APPLY 1/4 PATCH TO AFFECTED AREA EOD 07/27/15   Historical Provider, MD  ondansetron (ZOFRAN) 4 MG tablet Take 1 tablet (4 mg total) by mouth every 8 (eight) hours as needed for nausea or vomiting. Patient not taking: Reported on 06/18/2015 01/29/15   Charm RingsErin J Honig, MD   BP 132/87 mmHg  Pulse 76  Temp(Src) 98.1 F (36.7 C) (Oral)  Resp 20  Ht 6\' 3"  (1.905 m)  Wt 81.647 kg  BMI 22.50 kg/m2  SpO2 100%   Physical Exam  Constitutional: He is oriented to person, place, and time. He appears well-developed and well-nourished. No distress.  Nontoxic/nonseptic appearing  HENT:  Head: Normocephalic and atraumatic.  Eyes: Conjunctivae and EOM are normal. No scleral icterus.  Neck: Normal range of motion.  Cardiovascular: Normal rate, regular rhythm and intact distal pulses.   DP pulses 2+ bilaterally  Pulmonary/Chest: Effort normal.  No respiratory distress.  Musculoskeletal: Normal range of motion. He exhibits tenderness.       Lumbar back: He exhibits tenderness and pain. He exhibits no bony tenderness, no edema, no deformity and no spasm.       Back:  There is tenderness to palpation along the right lumbar paraspinal muscles without spasm. No tenderness to palpation to the thoracic or lumbar midline. No bony deformities, step-offs, or crepitus.  Neurological: He is  alert and oriented to person, place, and time. He exhibits normal muscle tone. Coordination normal.  Sensation to light touch intact in bilateral lower extremities. Patient ambulatory with steady gait.  Skin: Skin is warm and dry. No rash noted. He is not diaphoretic. No erythema. No pallor.  Psychiatric: He has a normal mood and affect. His behavior is normal.  Nursing note and vitals reviewed.   ED Course  Procedures (including critical care time) Labs Review Labs Reviewed - No data to display  Imaging Review No results found. I have personally reviewed and evaluated these images and lab results as part of my medical decision-making.   EKG Interpretation None      MDM   Final diagnoses:  Low back strain, initial encounter    30 year old male since the emergency department for evaluation of back pain. Suspect this to be secondary to occupational overuse and muscle strain. No red flags or signs concerning for cauda equina. No history of cancer or IV drug use. Patient ambulatory with steady gait. Tenderness reproducible on palpation. No tenderness noted to the thoracic or lumbar midline. Patient given instructions on supportive care. Will provide prescriptions for naproxen and Robaxin. Return precautions given at discharge. Patient agreeable to plan with no unaddressed concerns. Patient discharged in satisfactory condition.   Filed Vitals:   02/16/16 0137 02/16/16 0138 02/16/16 0311  BP: 129/94 129/94 132/87  Pulse: 82  76  Temp: 98.1 F (36.7 C)  98.1 F (36.7 C)  TempSrc: Oral  Oral  Resp: 18  20  Height:  (1.905 m)    Weight: 81.647 kg    SpO2: 100%  100%     [name], PA-C 02/16/16 2021  Dione Booze, MD 02/16/16 2228

## 2016-02-16 NOTE — ED Notes (Signed)
Patient is complaining of upper and lower back that started last week. Pt denies any recent injury but reports he works at [employer] and moves a lot of boxes.

## 2016-05-25 ENCOUNTER — Emergency Department (HOSPITAL_COMMUNITY)
Admission: EM | Admit: 2016-05-25 | Discharge: 2016-05-25 | Disposition: A | Discharge status: Home / Self Care | Payer: BLUE CROSS/BLUE SHIELD | Attending: Dermatology | Admitting: Dermatology

## 2016-05-25 ENCOUNTER — Encounter (HOSPITAL_COMMUNITY): Payer: Self-pay | Admitting: Emergency Medicine

## 2016-05-25 DIAGNOSIS — Y939 Activity, unspecified: Secondary | ICD-10-CM | POA: Diagnosis not present

## 2016-05-25 DIAGNOSIS — Z5321 Procedure and treatment not carried out due to patient leaving prior to being seen by health care provider: Secondary | ICD-10-CM | POA: Insufficient documentation

## 2016-05-25 DIAGNOSIS — S90561A Insect bite (nonvenomous), right ankle, initial encounter: Secondary | ICD-10-CM | POA: Diagnosis not present

## 2016-05-25 DIAGNOSIS — Y999 Unspecified external cause status: Secondary | ICD-10-CM | POA: Diagnosis not present

## 2016-05-25 DIAGNOSIS — Y929 Unspecified place or not applicable: Secondary | ICD-10-CM | POA: Diagnosis not present

## 2016-05-25 DIAGNOSIS — W57XXXA Bitten or stung by nonvenomous insect and other nonvenomous arthropods, initial encounter: Secondary | ICD-10-CM | POA: Diagnosis not present

## 2016-05-25 NOTE — ED Notes (Signed)
Pt stated "I ain't dying, so I ain't waiting." Pt left after triage

## 2016-05-25 NOTE — ED Triage Notes (Addendum)
Pt c/o pain to inside of right ankle where he states he was "bit by a spider." Pt reports killing the spider after feeling it bite him. No redness or swelling at this time.

## 2017-01-30 ENCOUNTER — Encounter (HOSPITAL_COMMUNITY): Payer: Self-pay | Admitting: *Deleted

## 2017-01-30 ENCOUNTER — Emergency Department (HOSPITAL_COMMUNITY)
Admission: EM | Admit: 2017-01-30 | Discharge: 2017-01-30 | Disposition: A | Discharge status: Home / Self Care | Payer: BLUE CROSS/BLUE SHIELD | Attending: Emergency Medicine | Admitting: Emergency Medicine

## 2017-01-30 DIAGNOSIS — Y929 Unspecified place or not applicable: Secondary | ICD-10-CM | POA: Insufficient documentation

## 2017-01-30 DIAGNOSIS — Y990 Civilian activity done for income or pay: Secondary | ICD-10-CM | POA: Insufficient documentation

## 2017-01-30 DIAGNOSIS — S61412A Laceration without foreign body of left hand, initial encounter: Secondary | ICD-10-CM | POA: Diagnosis not present

## 2017-01-30 DIAGNOSIS — W268XXA Contact with other sharp object(s), not elsewhere classified, initial encounter: Secondary | ICD-10-CM | POA: Insufficient documentation

## 2017-01-30 DIAGNOSIS — Z79899 Other long term (current) drug therapy: Secondary | ICD-10-CM | POA: Insufficient documentation

## 2017-01-30 DIAGNOSIS — Y939 Activity, unspecified: Secondary | ICD-10-CM | POA: Insufficient documentation

## 2017-01-30 NOTE — Discharge Instructions (Signed)
Your tetanus is up-to-date. Keep wound dry and covered. You can use Neosporin cream on it as needed.  Without fail for worsening symptoms, including pus drainage, fevers, escalating pain, worsening redness and swelling, or any other symptoms concerning to you

## 2017-01-30 NOTE — ED Triage Notes (Signed)
Pt states cut palm of hand on metal object.  Bleeding controlled.

## 2017-01-30 NOTE — ED Provider Notes (Addendum)
[facility]-EMERGENCY DEPT Provider Note    By signing my name below, I, Paul Cole, attest that this documentation has been prepared under the direction and in the presence of [name], MD. Electronically Signed: Earmon Cole, ED Scribe. 01/30/17. 7:13 PM.    History   Chief Complaint Chief Complaint  Patient presents with  . Hand Injury   The history is provided by the patient and medical records. No language interpreter was used.    Paul Cole is a 31 y.o. male who presents to the Emergency Department complaining of a laceration to the palmar aspect of left hand that occurred PTA at work. He states he cut the hand on an old rusted metal rod. He reports associated pain and bleeding that has been controlled. He has not taken anything for pain relief. Touching the area increases the pain. He denies alleviating factors. He denies numbness, tingling or weakness of the left hand or LUE. He is right hand dominant. His last tetanus vaccination was August 2016.   History reviewed. No pertinent past medical history.  There are no active problems to display for this patient.   History reviewed. No pertinent surgical history.     Home Medications    Prior to Admission medications   Medication Sig Start Date End Date Taking? Authorizing Provider  diclofenac (VOLTAREN) 75 MG EC tablet TAKE 1 TABLET TWICE DAILY WITH FOOD X 2 WEEKS, 1 TABLET DAILY X 2 WEEKS, THEN ONCE DAILY AS NEEDED 07/27/15   Historical Provider, MD  ibuprofen (ADVIL,MOTRIN) 800 MG tablet Take 1 tablet (800 mg total) by mouth every 8 (eight) hours as needed for mild pain or moderate pain. Patient not taking: Reported on 06/18/2015 02/01/15   [name], PA-C  methocarbamol (ROBAXIN) 500 MG tablet Take 1 tablet (500 mg total) by mouth 2 (two) times daily. 02/16/16   [name], PA-C  naproxen (NAPROSYN) 500 MG tablet Take 1 tablet (500 mg total) by mouth 2 (two) times daily. 02/16/16   [name], PA-C    nitroGLYCERIN (NITRODUR - DOSED IN MG/24 HR) 0.2 mg/hr patch APPLY 1/4 PATCH TO AFFECTED AREA EOD 07/27/15   Historical Provider, MD  ondansetron (ZOFRAN) 4 MG tablet Take 1 tablet (4 mg total) by mouth every 8 (eight) hours as needed for nausea or vomiting. Patient not taking: Reported on 06/18/2015 01/29/15   [name], MD    Family History No family history on file.  Social History Social History  Substance Use Topics  . Smoking status: Never Smoker  . Smokeless tobacco: Never Used  . Alcohol use Yes     Comment: occasional     Allergies   Patient has no known allergies.   Review of Systems Review of Systems  Constitutional: Negative for fever.  Skin: Positive for wound.  Allergic/Immunologic: Negative for immunocompromised state.  Neurological: Negative for weakness and numbness.  Hematological: Does not bruise/bleed easily.     Physical Exam Updated Vital Signs BP 113/88 (BP Location: Right Arm)   Pulse 63   Temp 98.3 F (36.8 C) (Oral)   Resp 16   Ht  (1.905 m)   Wt 175 lb (79.4 kg)   SpO2 99%   BMI 21.87 kg/m   Physical Exam Physical Exam  Nursing note and vitals reviewed. Constitutional: Well developed, well nourished, non-toxic, and in no acute distress Head: Normocephalic and atraumatic.  Mouth/Throat: Oropharynx is clear and moist.  Neck: Normal range of motion. Neck supple.  Cardiovascular: Normal rate  and regular rhythm.   Pulmonary/Chest: Effort normal and breath sounds normal.  Abdominal: Soft. There is no tenderness. There is no rebound and no guarding.  Musculoskeletal: Normal range of motion.  Neurological: Alert, no facial droop, fluent speech, moves all extremities symmetrically, Intact innervation of the median, ulnar, and radial nerves of the left hand Skin: Skin is warm and dry. Tiny, less than 1 cm superficial laceration over palm of left hand. Psychiatric: Cooperative   ED Treatments / Results  DIAGNOSTIC STUDIES: Oxygen  Saturation is 99% on RA, normal by my interpretation.   COORDINATION OF CARE: 7:11 PM- Will bandage wound. Pt verbalizes understanding and agrees to plan.  Medications - No data to display  Labs (all labs ordered are listed, but only abnormal results are displayed) Labs Reviewed - No data to display  EKG  EKG Interpretation None       Radiology No results found.  Procedures Procedures (including critical care time)  Medications Ordered in ED Medications - No data to display   Initial Impression / Assessment and Plan / ED Course  I have reviewed the triage vital signs and the nursing notes.  Pertinent labs & imaging results that were available during my care of the patient were reviewed by me and considered in my medical decision making (see chart for details).     There is a tiny superficial laceration over the palm of the left hand. It barely probes into the dermis of the tissue. No concerns for injury to deep structures. His tetanus is up-to-date. No laceration repair needed. Discussed wound care instructions for home. Band-Aid provided  Strict return and follow-up instructions reviewed. He expressed understanding of all discharge instructions and felt comfortable with the plan of care.   Final Clinical Impressions(s) / ED Diagnoses   Final diagnoses:  Laceration of left hand without foreign body, initial encounter    New Prescriptions New Prescriptions   No medications on file    I personally performed the services described in this documentation, which was scribed in my presence. The recorded information has been reviewed and is accurate.      [name], MD 01/30/17 1916    [name], MD 01/30/17 [phone]

## 2017-01-30 NOTE — ED Notes (Signed)
Pt left before DC paperwork was given to pt. Registration saw pt before he left and he was upset and didn't want to wait for paperwork

## 2017-02-07 ENCOUNTER — Encounter (HOSPITAL_COMMUNITY): Payer: Self-pay

## 2017-02-07 ENCOUNTER — Emergency Department (HOSPITAL_COMMUNITY)
Admission: EM | Admit: 2017-02-07 | Discharge: 2017-02-07 | Disposition: A | Discharge status: Home / Self Care | Payer: BLUE CROSS/BLUE SHIELD | Attending: Emergency Medicine | Admitting: Emergency Medicine

## 2017-02-07 DIAGNOSIS — M549 Dorsalgia, unspecified: Secondary | ICD-10-CM | POA: Diagnosis present

## 2017-02-07 DIAGNOSIS — X500XXA Overexertion from strenuous movement or load, initial encounter: Secondary | ICD-10-CM | POA: Diagnosis not present

## 2017-02-07 DIAGNOSIS — M545 Low back pain: Secondary | ICD-10-CM | POA: Insufficient documentation

## 2017-02-07 DIAGNOSIS — Y939 Activity, unspecified: Secondary | ICD-10-CM | POA: Diagnosis not present

## 2017-02-07 DIAGNOSIS — Z79899 Other long term (current) drug therapy: Secondary | ICD-10-CM | POA: Insufficient documentation

## 2017-02-07 DIAGNOSIS — Y929 Unspecified place or not applicable: Secondary | ICD-10-CM | POA: Diagnosis not present

## 2017-02-07 DIAGNOSIS — Y990 Civilian activity done for income or pay: Secondary | ICD-10-CM | POA: Insufficient documentation

## 2017-02-07 NOTE — ED Provider Notes (Signed)
[facility]-EMERGENCY DEPT Provider Note    By signing my name below, I, Paul Cole, attest that this documentation has been prepared under the direction and in the presence of Sharilyn Sites, PA-C. Electronically Signed: Earmon Cole, ED Scribe. 02/07/17. 2:52 PM.    History   Chief Complaint Chief Complaint  Patient presents with  . Neck Pain  . Back Pain   The history is provided by the patient and medical records. No language interpreter was used.    Paul Cole is a 31 y.o. male who presents to the Emergency Department complaining of ongoing back pain that began three years ago. He states he came to get a doctor's note for his job for light duty until he can see his orthopedist, Dr. August Saucer and already has an appt in nine days (02/16/17).  He works for [employer] and is required to lift heavy boxes frequently.  He has not taken anything for pain. Lifting and bending increases his pain. He denies alleviating factors. He denies fever, chills, nausea, vomiting, bruising, wounds, numbness, tingling or weakness of the lower extremities.   History reviewed. No pertinent past medical history.  There are no active problems to display for this patient.   History reviewed. No pertinent surgical history.     Home Medications    Prior to Admission medications   Medication Sig Start Date End Date Taking? Authorizing Provider  diclofenac (VOLTAREN) 75 MG EC tablet TAKE 1 TABLET TWICE DAILY WITH FOOD X 2 WEEKS, 1 TABLET DAILY X 2 WEEKS, THEN ONCE DAILY AS NEEDED 07/27/15   Historical Provider, MD  ibuprofen (ADVIL,MOTRIN) 800 MG tablet Take 1 tablet (800 mg total) by mouth every 8 (eight) hours as needed for mild pain or moderate pain. Patient not taking: Reported on 06/18/2015 02/01/15   [name], PA-C  methocarbamol (ROBAXIN) 500 MG tablet Take 1 tablet (500 mg total) by mouth 2 (two) times daily. 02/16/16   [name], PA-C  naproxen (NAPROSYN) 500 MG tablet Take 1 tablet (500 mg total) by  mouth 2 (two) times daily. 02/16/16   [name], PA-C  nitroGLYCERIN (NITRODUR - DOSED IN MG/24 HR) 0.2 mg/hr patch APPLY 1/4 PATCH TO AFFECTED AREA EOD 07/27/15   Historical Provider, MD  ondansetron (ZOFRAN) 4 MG tablet Take 1 tablet (4 mg total) by mouth every 8 (eight) hours as needed for nausea or vomiting. Patient not taking: Reported on 06/18/2015 01/29/15   [name], MD    Family History History reviewed. No pertinent family history.  Social History Social History  Substance Use Topics  . Smoking status: Never Smoker  . Smokeless tobacco: Never Used  . Alcohol use Yes     Comment: occasional     Allergies   Patient has no known allergies.   Review of Systems Review of Systems  Constitutional: Negative for chills and fever.  Gastrointestinal: Negative for nausea and vomiting.  Musculoskeletal: Positive for back pain.  Skin: Negative for color change and wound.  Neurological: Negative for weakness and numbness.     Physical Exam Updated Vital Signs BP 115/79 (BP Location: Left Arm)   Pulse 69   Temp 98.5 F (36.9 C) (Oral)   Ht 6' 3.5" (1.918 m)   Wt 170 lb (77.1 kg)   SpO2 99%   BMI 20.97 kg/m   Physical Exam  Constitutional: He is oriented to person, place, and time. He appears well-developed and well-nourished.  HENT:  Head: Normocephalic and atraumatic.  Mouth/Throat: Oropharynx is clear and  moist.  Eyes: Conjunctivae and EOM are normal. Pupils are equal, round, and reactive to light.  Neck: Normal range of motion.  Cardiovascular: Normal rate, regular rhythm and normal heart sounds.   Pulmonary/Chest: Effort normal and breath sounds normal. No respiratory distress. He has no wheezes.  Abdominal: Soft. Bowel sounds are normal.  Musculoskeletal: Normal range of motion.       Back:  Muscular tenderness of right lumbar paraspinal region, no midline deformities or step-off; full ROM maintained; normal strength/sensation of both legs, normal gait    Neurological: He is alert and oriented to person, place, and time.  Skin: Skin is warm and dry.  Psychiatric: He has a normal mood and affect. His behavior is normal.  Nursing note and vitals reviewed.    ED Treatments / Results  DIAGNOSTIC STUDIES: Oxygen Saturation is 99% on RA, normal by my interpretation.   COORDINATION OF CARE: 2:52 PM- Will provide work note and encouraged pt to keep appt with Dr. August Saucer. Pt verbalizes understanding and agrees to plan.  Medications - No data to display  Labs (all labs ordered are listed, but only abnormal results are displayed) Labs Reviewed - No data to display  EKG  EKG Interpretation None       Radiology No results found.  Procedures Procedures (including critical care time)  Medications Ordered in ED Medications - No data to display   Initial Impression / Assessment and Plan / ED Course  I have reviewed the triage vital signs and the nursing notes.  Pertinent labs & imaging results that were available during my care of the patient were reviewed by me and considered in my medical decision making (see chart for details).  31 year old male here with low back pain. Has been an ongoing issue for 3 years now. He works for [employer] and has to lift heavy boxes which increases pain. He has some right sided muscular tenderness of the lumbar spine without midline step-off or deformity. He has no focal neurologic deficit suggestive of cauda equina or other emergent spinal pathology. He has prescheduled follow-up with orthopedics in 9 days. He was given work note for restricted duty for a few days. Encouraged anti-inflammatories and heat therapy.  Discussed plan with patient, he acknowledged understanding and agreed with plan of care.  Return precautions given for new or worsening symptoms.  Final Clinical Impressions(s) / ED Diagnoses   Final diagnoses:  Acute low back pain, unspecified back pain laterality, with sciatica presence unspecified     New Prescriptions New Prescriptions   No medications on file   I personally performed the services described in this documentation, which was scribed in my presence. The recorded information has been reviewed and is accurate.     Garlon Hatchet, PA-C 02/07/17 1608    Mancel Bale, MD 02/07/17 [phone]

## 2017-02-07 NOTE — ED Triage Notes (Signed)
PT C/O NECK AND BACK PAIN WITH NUMBNESS IN THE HANDS DUE TO HIS CURRENT JOB WORKING FOR [employer]. PT STS THIS HAS BEEN AN ON-GOING ISSUE FOR 3 YEARS. DENIES INJURY.

## 2017-02-07 NOTE — ED Notes (Signed)
PT DISCHARGED. INSTRUCTIONS GIVEN. AAOX4. PT IN NO APPARENT DISTRESS. THE OPPORTUNITY TO ASK QUESTIONS WAS PROVIDED. 

## 2017-02-07 NOTE — Discharge Instructions (Signed)
Follow-up with Dr. August Saucer on 4/19 as scheduled.

## 2017-02-16 ENCOUNTER — Encounter (INDEPENDENT_AMBULATORY_CARE_PROVIDER_SITE_OTHER): Payer: Self-pay

## 2017-02-16 ENCOUNTER — Ambulatory Visit (INDEPENDENT_AMBULATORY_CARE_PROVIDER_SITE_OTHER): Payer: BLUE CROSS/BLUE SHIELD | Admitting: Orthopedic Surgery

## 2017-02-21 ENCOUNTER — Ambulatory Visit (INDEPENDENT_AMBULATORY_CARE_PROVIDER_SITE_OTHER): Payer: BLUE CROSS/BLUE SHIELD | Admitting: Orthopedic Surgery

## 2017-02-21 ENCOUNTER — Ambulatory Visit (INDEPENDENT_AMBULATORY_CARE_PROVIDER_SITE_OTHER): Payer: Self-pay

## 2017-02-21 ENCOUNTER — Encounter (INDEPENDENT_AMBULATORY_CARE_PROVIDER_SITE_OTHER): Payer: Self-pay | Admitting: Orthopedic Surgery

## 2017-02-21 DIAGNOSIS — M542 Cervicalgia: Secondary | ICD-10-CM | POA: Diagnosis not present

## 2017-02-21 DIAGNOSIS — G8929 Other chronic pain: Secondary | ICD-10-CM

## 2017-02-21 DIAGNOSIS — M545 Low back pain: Secondary | ICD-10-CM

## 2017-02-21 DIAGNOSIS — M5412 Radiculopathy, cervical region: Secondary | ICD-10-CM | POA: Diagnosis not present

## 2017-02-22 NOTE — Progress Notes (Signed)
Office Visit Note   Patient: Paul Cole           Date of Birth: 1986/04/25           MRN: [id number] Visit Date: 02/21/2017 Requested by: No referring provider defined for this encounter. PCP: No PCP Per Patient  Subjective: Chief Complaint  Patient presents with  . Neck - Pain  . Lower Back - Pain    HPI: Paul Cole is a 31 year old patient with neck shoulder and back pain.  Reports pain from the neck only down to the lower back. On for 2 years but the last 2-3 months the pain is become constant.  It is worse with lifting.  Denies any leg pain.  Takes ibuprofen for his symptoms.  He is waking from sleep at night.  He went to the emergency room 2 weeks ago and he was given a note for work with no lifting.  MRI of the cervical spine in 2016 showed a mild disc bulge at C5-6.  History and play basketball 3 weeks ago but he had to stop.  He does a lot of work at [employer].  This involves a lot of sorting and lifting.              ROS: All systems reviewed are negative as they relate to the chief complaint within the history of present illness.  Patient denies  fevers or chills.   Assessment & Plan: Visit Diagnoses:  1. Chronic midline low back pain without sciatica   2. Cervical radiculopathy   3. Cervicalgia     Plan: Impression is cervical spine pain with normal recent MRI scan.  I think this is something we can watch.  More concerning is his the lower back pain and rib pain.  I think he is having a fair amount of symptoms with that and he also has new radiographic abnormalities at T12 and L1.  He denies any other systemic symptoms of illness such as weight loss fever or fatigue.  Plan is for MRI L-spine to evaluate these new nontraumatic which deformities at T12 and L1 all see him back after those studies.  I'm going to give him a note with no lifting more and 10 pounds for 3 weeks while the low back workup is in progress  Follow-Up Instructions: No Follow-up on file.   Orders:  Orders  Placed This Encounter  Procedures  . XR Cervical Spine 2 or 3 views  . XR Lumbar Spine 2-3 Views  . MR Lumbar Spine w/o contrast   No orders of the defined types were placed in this encounter.     Procedures: No procedures performed   Clinical Data: No additional findings.  Objective: Vital Signs: There were no vitals taken for this visit.  Physical Exam:   Constitutional: Patient appears well-developed HEENT:  Head: Normocephalic Eyes:EOM are normal Neck: Normal range of motion Cardiovascular: Normal rate Pulmonary/chest: Effort normal Neurologic: Patient is alert Skin: Skin is warm Psychiatric: Patient has normal mood and affect    Ortho Exam: Orthopedic exam demonstrates full active and passive range of motion of the neck with good shoulder range of motion rotator cuff strength no paresthesias in the arms he does report some subjective fingertip numbness but negative Tinel's cubital tunnel bilaterally no muscle atrophy 5 out of 5 grip EPL FPL interosseous wrist flexion-extension biceps triceps and deltoid strength.  Examination of the back demonstrates no nerve retention signs a lot of pain with forward lateral bending the trochanteric  tenderness is noted pedal pulses palpable negative Babinski negative clonus no masses lymph adenopathy or skin changes noted in the back region.  He has no paresthesias L1 S1 bilaterally  Specialty Comments:  No specialty comments available.  Imaging: No results found.   PMFS History: There are no active problems to display for this patient.  No past medical history on file.  No family history on file.  No past surgical history on file. Social History   Occupational History  . Not on file.   Social History Main Topics  . Smoking status: Never Smoker  . Smokeless tobacco: Never Used  . Alcohol use Yes     Comment: occasional  . Drug use: No  . Sexual activity: Not on file

## 2017-03-01 ENCOUNTER — Other Ambulatory Visit (INDEPENDENT_AMBULATORY_CARE_PROVIDER_SITE_OTHER): Payer: Self-pay

## 2017-03-01 NOTE — Progress Notes (Signed)
Patient came into office today stating he was seen last week and was given option for LSO but refused. States now today needs LSO. This was provided for patient.

## 2017-03-08 ENCOUNTER — Ambulatory Visit
Admission: RE | Admit: 2017-03-08 | Discharge: 2017-03-08 | Disposition: A | Discharge status: Home / Self Care | Payer: BLUE CROSS/BLUE SHIELD | Source: Ambulatory Visit | Attending: Orthopedic Surgery | Admitting: Orthopedic Surgery

## 2017-03-08 DIAGNOSIS — M545 Low back pain: Principal | ICD-10-CM

## 2017-03-08 DIAGNOSIS — G8929 Other chronic pain: Secondary | ICD-10-CM

## 2017-03-14 ENCOUNTER — Telehealth (INDEPENDENT_AMBULATORY_CARE_PROVIDER_SITE_OTHER): Payer: Self-pay | Admitting: *Deleted

## 2017-03-14 NOTE — Telephone Encounter (Signed)
Pt called wanting to know results of his MRI, I advised pt to make appointment so him and doctor can discuss results and go over options, pt was really upset that he had to make appt to come in and states he works two jobs and is impossible for him to get off work just to go over results, I gave him appt of May 30th and states not going to work that day because it is to far out and he is having hard time working with his shoulder. Pt would like a call back from Dr. August Saucerean on results.   Please call [phone]

## 2017-03-15 NOTE — Telephone Encounter (Signed)
Please advise on MRI results. Patient upset about having to make ROV for results b/c he works 2 jobs

## 2017-03-16 NOTE — Telephone Encounter (Signed)
I tried calling the above number.  That number is not accurate for reaching Paul Cole.  Please call him and informed him him that he has a small disc protrusion and I would like him to see Dr. Alvester MorinNewton  for an injection.  This does not look like it will require surgery

## 2017-03-17 NOTE — Telephone Encounter (Signed)
I tried to call as well but received same notification. Greeting that is played states a completely different number than what was dialed. I tried calling several times and each time was same notification.

## 2017-03-30 ENCOUNTER — Encounter (INDEPENDENT_AMBULATORY_CARE_PROVIDER_SITE_OTHER): Payer: Self-pay | Admitting: Orthopedic Surgery

## 2017-03-30 ENCOUNTER — Ambulatory Visit (INDEPENDENT_AMBULATORY_CARE_PROVIDER_SITE_OTHER): Payer: BLUE CROSS/BLUE SHIELD | Admitting: Orthopedic Surgery

## 2017-03-30 DIAGNOSIS — G8929 Other chronic pain: Secondary | ICD-10-CM | POA: Diagnosis not present

## 2017-03-30 DIAGNOSIS — M545 Low back pain: Secondary | ICD-10-CM | POA: Diagnosis not present

## 2017-03-30 MED ORDER — METHOCARBAMOL 500 MG PO TABS: 500.0000 mg | ORAL_TABLET | Freq: Three times a day (TID) | ORAL | 0 refills | Status: DC | PRN

## 2017-03-30 MED ORDER — MELOXICAM 15 MG PO TABS: 15.0000 mg | ORAL_TABLET | Freq: Every day | ORAL | 0 refills | Status: DC

## 2017-03-30 NOTE — Progress Notes (Signed)
Office Visit Note   Patient: Paul Cole           Date of Birth: Jul 01, 1986           MRN: [id number] Visit Date: 03/30/2017 Requested by: No referring provider defined for this encounter. PCP: Patient, No Pcp Per  Subjective: Chief Complaint  Patient presents with  . Lower Back - Pain, Follow-up    HPI: Paul Cole is a 31 year old patient with low back pain.  Since of [facility] he's had an MRI scan.  The scan and report are reviewed.  Scan shows a small disc bulge at L5-S1 on the left.  This is pretty minimal.  There is some hardness of the L5-S1 disc but otherwise the exam is unremarkable.  He's been wearing a brace.  He takes no medications except occasional ibuprofen.he states that the pain is back prevented him from playing basketball the other day.              ROS: All systems reviewed are negative as they relate to the chief complaint within the history of present illness.  Patient denies  fevers or chills.   Assessment & Plan: Visit Diagnoses: No diagnosis found.  Plan: impression is low back pain which has fairly minimal findings on MRI scan.  Does have a small bulging disc at L5-S1 with some loss of water content in that disc.  No overt nerve compression is noted.  He is not having really much in the way of radicular symptoms.  Plan at this time is for a formal course of physical therapy referred afternoon for L-spine ESI and we'll put him on muscle relaxer and anti-inflammatory out of work for the next 3 weeks and then he needs to return to work.  If he cannot work in need to consider other types of employment which are not so demanding on his back.  I'll see him back as needed  Follow-Up Instructions: No Follow-up on file.   Orders:  No orders of the defined types were placed in this encounter.  Meds ordered this encounter  Medications  . methocarbamol (ROBAXIN) 500 MG tablet    Sig: Take 1 tablet (500 mg total) by mouth every 8 (eight) hours as needed for muscle spasms.      Dispense:  30 tablet    Refill:  0  . meloxicam (MOBIC) 15 MG tablet    Sig: Take 1 tablet (15 mg total) by mouth daily.    Dispense:  30 tablet    Refill:  0      Procedures: No procedures performed   Clinical Data: No additional findings.  Objective: Vital Signs: There were no vitals taken for this visit.  Physical Exam:   Constitutional: Patient appears well-developed HEENT:  Head: Normocephalic Eyes:EOM are normal Neck: Normal range of motion Cardiovascular: Normal rate Pulmonary/chest: Effort normal Neurologic: Patient is alert Skin: Skin is warm Psychiatric: Patient has normal mood and affect    Ortho Exam: orthopedic exam demonstrates some disability getting up from the seated position.  He has some pain with forward lateral bending but no trochanteric tenderness.  No definite nerve retention signs today no paresthesias good quad strengthening.  Strength no/we is noted  Specialty Comments:  No specialty comments available.  Imaging: No results found.   PMFS History: There are no active problems to display for this patient.  No past medical history on file.  No family history on file.  No past surgical history on file. Social History  Occupational History  . Not on file.   Social History Main Topics  . Smoking status: Never Smoker  . Smokeless tobacco: Never Used  . Alcohol use Yes     Comment: occasional  . Drug use: No  . Sexual activity: Not on file

## 2017-04-17 ENCOUNTER — Telehealth (INDEPENDENT_AMBULATORY_CARE_PROVIDER_SITE_OTHER): Payer: Self-pay | Admitting: Orthopedic Surgery

## 2017-04-17 NOTE — Telephone Encounter (Signed)
y

## 2017-04-17 NOTE — Telephone Encounter (Signed)
Ok for note 

## 2017-04-17 NOTE — Telephone Encounter (Signed)
Patient called asking for a note saying he is cleared to go back to work. CB # [phone]

## 2017-04-18 ENCOUNTER — Telehealth (INDEPENDENT_AMBULATORY_CARE_PROVIDER_SITE_OTHER): Payer: Self-pay | Admitting: *Deleted

## 2017-04-18 ENCOUNTER — Telehealth (INDEPENDENT_AMBULATORY_CARE_PROVIDER_SITE_OTHER): Payer: Self-pay

## 2017-04-18 NOTE — Telephone Encounter (Signed)
Pt called asking if work note can be faxed. Faxed to [phone]

## 2017-04-18 NOTE — Telephone Encounter (Signed)
Note written for patient. IC to advise could pick up at front desk but no answer. No VM to LM.

## 2017-04-18 NOTE — Telephone Encounter (Signed)
Patient called concerning note for work.  Advised patient that note is ready to be picked up at the front desk.

## 2017-04-24 ENCOUNTER — Ambulatory Visit (INDEPENDENT_AMBULATORY_CARE_PROVIDER_SITE_OTHER): Payer: BLUE CROSS/BLUE SHIELD

## 2017-04-24 ENCOUNTER — Encounter (INDEPENDENT_AMBULATORY_CARE_PROVIDER_SITE_OTHER): Payer: Self-pay | Admitting: Physical Medicine and Rehabilitation

## 2017-04-24 ENCOUNTER — Ambulatory Visit (INDEPENDENT_AMBULATORY_CARE_PROVIDER_SITE_OTHER): Payer: BLUE CROSS/BLUE SHIELD | Admitting: Physical Medicine and Rehabilitation

## 2017-04-24 VITALS — BP 119/69 | HR 72

## 2017-04-24 DIAGNOSIS — M5416 Radiculopathy, lumbar region: Secondary | ICD-10-CM

## 2017-04-24 DIAGNOSIS — M5116 Intervertebral disc disorders with radiculopathy, lumbar region: Secondary | ICD-10-CM

## 2017-04-24 MED ORDER — METHYLPREDNISOLONE ACETATE 80 MG/ML IJ SUSP
80.0000 mg | Freq: Once | INTRAMUSCULAR | Status: AC
Start: 2017-04-24 — End: 2017-04-24
  Administered 2017-04-24: 80 mg

## 2017-04-24 MED ORDER — LIDOCAINE HCL (PF) 1 % IJ SOLN
2.0000 mL | Freq: Once | INTRAMUSCULAR | Status: AC
  Administered 2017-04-24: 2 mL

## 2017-04-24 NOTE — Progress Notes (Deleted)
Pain across lower back for around one year. Soreness in the back of thighs at times. Pain worse with walking and standing. Wearing back brace.

## 2017-04-25 NOTE — Procedures (Signed)
Lumbar Epidural Steroid Injection - Interlaminar Approach with Fluoroscopic Guidance  Patient: Paul Cole      Date of Birth: 28-Nov-1985 MRN: [id number] PCP: Patient, No Pcp Per      Visit Date: 04/24/2017   Paul Cole is a 31 year old gentleman with small central disc protrusion at L5-S1 MRI with chronic worsening low back pain and bilateral buttock and leg pain. He is felt conservative care otherwise. He is followed by Dr. August Saucerean who request epidural injection diagnostically and hopefully therapeutically. On exam he has good distal strength without any deficits. He ambulates without aid.  Universal Protocol:    Date/Time: 06/26/186:12 AM  Consent Given By: the patient  Position: PRONE  Additional Comments: Vital signs were monitored before and after the procedure. Patient was prepped and draped in the usual sterile fashion. The correct patient, procedure, and site was verified.   Injection Procedure Details:  Procedure Site One Meds Administered:  Meds ordered this encounter  Medications  . lidocaine (PF) (XYLOCAINE) 1 % injection 2 mL  . methylPREDNISolone acetate (DEPO-MEDROL) injection 80 mg     Laterality: Left  Location/Site:  L5-S1  Needle size: 20 G  Needle type: Tuohy  Needle Placement: Paramedian epidural  Findings:  -Contrast Used: 1 mL iohexol 180 mg iodine/mL   -Comments: Excellent flow of contrast into the epidural space.  Patient felt a weird sensation with the injectate and felt like his spine had water running and as we were delivering the medication. After that he did fine numbness resolved.  Procedure Details: Using a paramedian approach from the side mentioned above, the region overlying the inferior lamina was localized under fluoroscopic visualization and the soft tissues overlying this structure were infiltrated with 4 ml. of 1% Lidocaine without Epinephrine. The Tuohy needle was inserted into the epidural space using a paramedian approach.    The epidural space was localized using loss of resistance along with lateral and bi-planar fluoroscopic views.  After negative aspirate for air, blood, and CSF, a 2 ml. volume of Isovue-250 was injected into the epidural space and the flow of contrast was observed. Radiographs were obtained for documentation purposes.    The injectate was administered into the level noted above.   Additional Comments:  The patient tolerated the procedure well No complications occurred Dressing: Band-Aid    Post-procedure details: Patient was observed during the procedure. Post-procedure instructions were reviewed.  Patient left the clinic in stable condition.

## 2017-04-25 NOTE — Patient Instructions (Signed)

## 2017-08-21 ENCOUNTER — Ambulatory Visit (HOSPITAL_COMMUNITY)
Admission: EM | Admit: 2017-08-21 | Discharge: 2017-08-21 | Disposition: A | Discharge status: Home / Self Care | Payer: BLUE CROSS/BLUE SHIELD | Attending: Family Medicine | Admitting: Family Medicine

## 2017-08-21 ENCOUNTER — Encounter (HOSPITAL_COMMUNITY): Payer: Self-pay | Admitting: Emergency Medicine

## 2017-08-21 DIAGNOSIS — K529 Noninfective gastroenteritis and colitis, unspecified: Secondary | ICD-10-CM | POA: Diagnosis not present

## 2017-08-21 MED ORDER — ONDANSETRON HCL 4 MG PO TABS: 4.0000 mg | ORAL_TABLET | Freq: Four times a day (QID) | ORAL | 0 refills | Status: AC

## 2017-08-21 NOTE — ED Triage Notes (Signed)
Pt ate [employer] earlier and c/o stomach pain and feeing "hot". Pt c/o vomiting and diarrhea.

## 2017-08-21 NOTE — ED Provider Notes (Signed)
[facility]    CSN: [id number] Arrival date & time: 08/21/17  1326     History   Chief Complaint Chief Complaint  Patient presents with  . Chills  . Abdominal Cramping    HPI Paul Cole is a 31 y.o. male.   Paul Cole presents with complaints of episode of vomiting and diarrhea yesterday. Today with body aches. Denies any vomiting or diarrhea today. Denies abdominal pain. Has tolerated eating soup. Had a similar episode 1 year ago that resolved without treatment. Was eating at [employer] yesterday at onset of symptoms. Took alkaselzer once which did help. Mild cough, without sore throat, ear pain or runny nose. No known ill contacts.       History reviewed. No pertinent past medical history.  There are no active problems to display for this patient.   History reviewed. No pertinent surgical history.     Home Medications    Prior to Admission medications   Medication Sig Start Date End Date Taking? Authorizing Provider  ibuprofen (ADVIL,MOTRIN) 800 MG tablet Take 1 tablet (800 mg total) by mouth every 8 (eight) hours as needed for mild pain or moderate pain. Patient not taking: Reported on 06/18/2015 02/01/15   [name], PA-C  meloxicam (MOBIC) 15 MG tablet Take 1 tablet (15 mg total) by mouth daily. 03/30/17   Cammy Copa, MD  methocarbamol (ROBAXIN) 500 MG tablet Take 1 tablet (500 mg total) by mouth every 8 (eight) hours as needed for muscle spasms. 03/30/17   Cammy Copa, MD  ondansetron (ZOFRAN) 4 MG tablet Take 1 tablet (4 mg total) by mouth every 6 (six) hours. 08/21/17   Georgetta Haber, NP    Family History No family history on file.  Social History Social History  Substance Use Topics  . Smoking status: Never Smoker  . Smokeless tobacco: Never Used  . Alcohol use Yes     Comment: occasional     Allergies   Patient has no known allergies.   Review of Systems Review of Systems  Constitutional: Negative.  Negative  for fever.  HENT: Negative.   Eyes: Negative.   Respiratory: Positive for cough. Negative for shortness of breath and wheezing.   Gastrointestinal: Positive for diarrhea and vomiting. Negative for abdominal distention, abdominal pain, anal bleeding, blood in stool, constipation, nausea and rectal pain.  Genitourinary: Negative.   Musculoskeletal: Positive for myalgias. Negative for back pain and joint swelling.     Physical Exam Triage Vital Signs ED Triage Vitals [08/21/17 1350]  Enc Vitals Group     BP 112/62     Pulse Rate (!) 54     Resp 16     Temp 98.8 F (37.1 C)     Temp Source Oral     SpO2 99 %     Weight 180 lb (81.6 kg)     Height 6\' 2"  (1.88 m)     Head Circumference      Peak Flow      Pain Score 7     Pain Loc      Pain Edu?      Excl. in GC?    No data found.   Updated Vital Signs BP 112/62 (BP Location: Right Arm)   Pulse (!) 54   Temp 98.8 F (37.1 C) (Oral)   Resp 16   Ht 6\' 2"  (1.88 m)   Wt 180 lb (81.6 kg)   SpO2 99%   BMI 23.11 kg/m  Visual Acuity Right Eye Distance:   Left Eye Distance:   Bilateral Distance:    Right Eye Near:   Left Eye Near:    Bilateral Near:     Physical Exam  Constitutional: He is oriented to person, place, and time. He appears well-developed and well-nourished. No distress.  Cardiovascular: Regular rhythm and normal heart sounds.   Pulmonary/Chest: Effort normal and breath sounds normal.  Abdominal: Soft. He exhibits no distension. There is no tenderness. There is no rebound and no guarding.  Neurological: He is alert and oriented to person, place, and time.  Skin: Skin is warm and dry.     UC Treatments / Results  Labs (all labs ordered are listed, but only abnormal results are displayed) Labs Reviewed - No data to display  EKG  EKG Interpretation None       Radiology No results found.  Procedures Procedures (including critical care time)  Medications Ordered in UC Medications - No data  to display   Initial Impression / Assessment and Plan / UC Course  I have reviewed the triage vital signs and the nursing notes.  Pertinent labs & imaging results that were available during my care of the patient were reviewed by me and considered in my medical decision making (see chart for details).     Without acute findings on exam. Symptoms have improved today, patient is tolerating PO intake. Vitals stable. Continue to take fluids to prevent dehydration. zofran as needed. Bland diet, advance as tolerated. Expect this to resolve without further treatment. If symptoms worsen to return to be seen or follow up with PCP. Patient verbalized understanding and agreeable to plan.    Final Clinical Impressions(s) / UC Diagnoses   Final diagnoses:  Gastroenteritis    New Prescriptions New Prescriptions   ONDANSETRON (ZOFRAN) 4 MG TABLET    Take 1 tablet (4 mg total) by mouth every 6 (six) hours.     Controlled Substance Prescriptions [place] Controlled Substance Registry consulted? Not Applicable   Georgetta HaberBurky, Natalie B, NP 08/21/17 1423

## 2017-09-07 ENCOUNTER — Emergency Department (HOSPITAL_COMMUNITY)
Admission: EM | Admit: 2017-09-07 | Discharge: 2017-09-07 | Disposition: A | Discharge status: Home / Self Care | Payer: BLUE CROSS/BLUE SHIELD | Attending: Emergency Medicine | Admitting: Emergency Medicine

## 2017-09-07 ENCOUNTER — Encounter (HOSPITAL_COMMUNITY): Payer: Self-pay | Admitting: *Deleted

## 2017-09-07 DIAGNOSIS — M542 Cervicalgia: Secondary | ICD-10-CM | POA: Diagnosis not present

## 2017-09-07 DIAGNOSIS — M25519 Pain in unspecified shoulder: Secondary | ICD-10-CM | POA: Insufficient documentation

## 2017-09-07 DIAGNOSIS — M549 Dorsalgia, unspecified: Secondary | ICD-10-CM | POA: Diagnosis not present

## 2017-09-07 DIAGNOSIS — W208XXA Other cause of strike by thrown, projected or falling object, initial encounter: Secondary | ICD-10-CM | POA: Diagnosis not present

## 2017-09-07 MED ORDER — METHOCARBAMOL 500 MG PO TABS: 500.0000 mg | ORAL_TABLET | Freq: Three times a day (TID) | ORAL | 0 refills | Status: AC | PRN

## 2017-09-07 MED ORDER — MELOXICAM 15 MG PO TABS: 15.0000 mg | ORAL_TABLET | Freq: Every day | ORAL | 0 refills | Status: AC

## 2017-09-07 NOTE — ED Notes (Signed)
No signature on file as pt left before

## 2017-09-07 NOTE — ED Provider Notes (Signed)
[facility]EMERGENCY DEPT Provider Note   CSN: [id number] Arrival date & time: 09/07/17  1151     History   Chief Complaint Chief Complaint  Patient presents with  . Neck Pain  . Shoulder Pain  . Back Pain    HPI Paul Cole is a 31 y.o. male.  HPI   Patient is a 31 year old male with a history of low back pain presenting for posterior neck musculature pain after playing basketball last night.  Patient reports that the rim of the basketball hoop came down after shooting a throw and came down on his neck.  Patient did not lose consciousness.  Patient does not have any blurry vision, double vision or dizziness.  No headache.  Patient has no retrograde amnesia.  Patient is not experiencing any numbness or weakness in the distal upper extremities.   History reviewed. No pertinent past medical history.  There are no active problems to display for this patient.   History reviewed. No pertinent surgical history.     Home Medications    Prior to Admission medications   Medication Sig Start Date End Date Taking? Authorizing Provider  ibuprofen (ADVIL,MOTRIN) 800 MG tablet Take 1 tablet (800 mg total) by mouth every 8 (eight) hours as needed for mild pain or moderate pain. Patient not taking: Reported on 06/18/2015 02/01/15   Trixie DredgeWest, Emily, PA-C  meloxicam (MOBIC) 15 MG tablet Take 1 tablet (15 mg total) daily by mouth. 09/07/17   Aviva KluverMurray, Ariyon Mittleman B, PA-C  methocarbamol (ROBAXIN) 500 MG tablet Take 1 tablet (500 mg total) every 8 (eight) hours as needed by mouth for muscle spasms. 09/07/17   Aviva KluverMurray, Zubair Lofton B, PA-C  ondansetron (ZOFRAN) 4 MG tablet Take 1 tablet (4 mg total) by mouth every 6 (six) hours. 08/21/17   Georgetta HaberBurky, Natalie B, NP    Family History No family history on file.  Social History Social History   Tobacco Use  . Smoking status: Never Smoker  . Smokeless tobacco: Never Used  Substance Use Topics  . Alcohol use: Yes    Comment: occasional  .  Drug use: No     Allergies   Patient has no known allergies.   Review of Systems Review of Systems  Musculoskeletal: Positive for neck pain.  Neurological: Negative for dizziness, syncope, weakness, light-headedness and numbness.     Physical Exam Updated Vital Signs BP 117/83 (BP Location: Right Arm)   Pulse 73   Temp 97.9 F (36.6 C) (Oral)   Resp 16   Ht 6\' 2"  (1.88 m)   Wt 79.4 kg (175 lb)   SpO2 99%   BMI 22.47 kg/m   Physical Exam  Constitutional: He appears well-developed and well-nourished. No distress.  Sitting comfortably in examination chair.  HENT:  Head: Normocephalic and atraumatic.  Eyes: Conjunctivae are normal. Right eye exhibits no discharge. Left eye exhibits no discharge.  EOMs normal to gross examination.  Neck: Normal range of motion.  Cardiovascular: Normal rate and regular rhythm.  Intact, 2+ radial pulse bilaterally.  Pulmonary/Chest:  Normal respiratory effort. Patient converses comfortably. No audible wheeze or stridor.  Abdominal: He exhibits no distension.  Musculoskeletal: Normal range of motion.  PALPATION: No midline but paraspinal musculature tenderness of cervical and thoracic spine.  Trigger point tenderness over bilateral trapezius muscles. ROM of cervical spine intact with flexion/extension/lateral flexion/lateral rotation; Patient can laterally rotate cervical spine greater than 45 degrees.  MOTOR: 5/5 strength b/l with resisted shoulder abduction/adduction, biceps flexion (C5/6), biceps extension (  C6-C8), wrist flexion, wrist extension (C6-C8), and grip strength (C7-T1) 2+ DTRs in the biceps and brachioradialis SENSORY: Sensation is intact to light touch in:  Superficial radial nerve distribution (dorsal first web space) Median nerve distribution (tip of index finger)   Ulnar nerve distribution (tip of small finger)   Neurological: He is alert.  Cranial nerves intact to gross observation. Patient moves extremities without  difficulty.  Skin: Skin is warm and dry. He is not diaphoretic.  Psychiatric: He has a normal mood and affect. His behavior is normal. Judgment and thought content normal.  Nursing note and vitals reviewed.    ED Treatments / Results  Labs (all labs ordered are listed, but only abnormal results are displayed) Labs Reviewed - No data to display  EKG  EKG Interpretation None       Radiology No results found.  Procedures Procedures (including critical care time)  Medications Ordered in ED Medications - No data to display   Initial Impression / Assessment and Plan / ED Course  I have reviewed the triage vital signs and the nursing notes.  Pertinent labs & imaging results that were available during my care of the patient were reviewed by me and considered in my medical decision making (see chart for details).      Final Clinical Impressions(s) / ED Diagnoses   Final diagnoses:  Neck pain   Patient with neck injury that occurred approximately 18 hours ago and is well-appearing at this time.  Negative Nexus.  There is no midline tenderness.  Patient is deferring x-rays at this time.  Low suspicion for cervical fracture.  Patient wishes for refill of his methocarbamol and meloxicam.  Patient has follow-up on Monday with his orthopedic physician and will reevaluate this pain at that time.  Return precautions given for any worsening pain, numbness, tingling, weakness in the upper extremities.  Patient instructed that he should not work, drive, operate heavy machinery while taking muscle relaxants.  Patient instructed not to drink alcohol while taking methocarbamol.  Patient is in understanding and agrees with the plan of care.  ED Discharge Orders        Ordered    meloxicam (MOBIC) 15 MG tablet  Daily     09/07/17 1524    methocarbamol (ROBAXIN) 500 MG tablet  Every 8 hours PRN     09/07/17 1524       Delia ChimesMurray, Salley Boxley B, PA-C 09/07/17 1525    Nira Connardama, Pedro Eduardo,  MD 09/12/17 1736

## 2017-09-07 NOTE — Discharge Instructions (Signed)
Please see the information and instructions below regarding your visit.  Your diagnoses today include:  1. Neck pain    You may have a strain of the musculature of the neck.  You may follow-up with your orthopedic physician for this.  I refilled your medications today.  Tests performed today include: See side panel of your discharge paperwork for testing performed today. Vital signs are listed at the bottom of these instructions.   Medications prescribed:    Take any prescribed medications only as prescribed, and any over the counter medications only as directed on the packaging.  You are prescribed Meloxicam, a non-steroidal anti-inflammatory agent (NSAID) for pain. You may take 15mg  every dailyas needed for pain. If still requiring this medication around the clock for acute pain after 10 days, please see your primary healthcare provider.  You may combine this medication with Tylenol, 650 mg every 6 hours, so you are receiving something for pain every 3 hours.  This is not a long-term medication unless under the care and direction of your primary provider. Taking this medication long-term and not under the supervision of a healthcare provider could increase the risk of stomach ulcers, kidney problems, and cardiovascular problems such as high blood pressure.    You are prescribed Robaxin, a muscle relaxant. Some common side effects of this medication include:  Feeling sleepy.  Dizziness. Take care upon going from a seated to a standing position.  Dry mouth.  Feeling tired or weak.  Hard stools (constipation).  Upset stomach. These are not all of the side effects that may occur. If you have questions about side effects, call your doctor. Call your primary care provider for medical advice about side effects.  This medication can be sedating. Only take this medication as needed. Please do not combine with alcohol. Do not drive or operate machinery while taking this medication.   This  medication can interact with some other medications. Make sure to tell any provider you are taking this medication before they prescribe you a new medication.    Home care instructions:  Please follow any educational materials contained in this packet.   Follow-up instructions: Please follow-up with Dr. August Saucerean in orthopedics.  Return instructions:  Please return to the Emergency Department if you experience worsening symptoms.  Please return to the emergency department for any worsening headaches, visual changes, confusion, feel like you are going to pass out, numbness or weakness in your distal upper extremities. Please return if you have any other emergent concerns.  Additional Information:   Your vital signs today were: BP 117/83 (BP Location: Right Arm)    Pulse 73    Temp 97.9 F (36.6 C) (Oral)    Resp 16    Ht 6\' 2"  (1.88 m)    Wt 79.4 kg (175 lb)    SpO2 99%    BMI 22.47 kg/m  If your blood pressure (BP) was elevated on multiple readings during this visit above 130 for the top number or above 80 for the bottom number, please have this repeated by your primary care provider within one month. --------------  Thank you for allowing us to participate in your care today.

## 2017-09-07 NOTE — ED Triage Notes (Signed)
Pt complains of neck, back and shoulder pain since playing basketball yesterday.

## 2018-02-27 ENCOUNTER — Emergency Department (HOSPITAL_COMMUNITY): Payer: Self-pay

## 2018-02-27 ENCOUNTER — Emergency Department (HOSPITAL_COMMUNITY)
Admission: EM | Admit: 2018-02-27 | Discharge: 2018-02-27 | Disposition: A | Discharge status: Home / Self Care | Payer: Self-pay | Attending: Emergency Medicine | Admitting: Emergency Medicine

## 2018-02-27 ENCOUNTER — Other Ambulatory Visit: Payer: Self-pay

## 2018-02-27 ENCOUNTER — Encounter (HOSPITAL_COMMUNITY): Payer: Self-pay

## 2018-02-27 DIAGNOSIS — Y9289 Other specified places as the place of occurrence of the external cause: Secondary | ICD-10-CM | POA: Insufficient documentation

## 2018-02-27 DIAGNOSIS — S0292XA Unspecified fracture of facial bones, initial encounter for closed fracture: Secondary | ICD-10-CM

## 2018-02-27 DIAGNOSIS — Y9389 Activity, other specified: Secondary | ICD-10-CM | POA: Insufficient documentation

## 2018-02-27 DIAGNOSIS — Y999 Unspecified external cause status: Secondary | ICD-10-CM | POA: Insufficient documentation

## 2018-02-27 DIAGNOSIS — Z79899 Other long term (current) drug therapy: Secondary | ICD-10-CM | POA: Insufficient documentation

## 2018-02-27 DIAGNOSIS — R42 Dizziness and giddiness: Secondary | ICD-10-CM

## 2018-02-27 MED ORDER — NITROGLYCERIN 2 % TD OINT: 1.0000 [in_us] | TOPICAL_OINTMENT | Freq: Four times a day (QID) | TRANSDERMAL | Status: DC

## 2018-02-27 MED ORDER — FENTANYL CITRATE (PF) 100 MCG/2ML IJ SOLN
50.0000 ug | Freq: Once | INTRAMUSCULAR | Status: DC
  Filled 2018-02-27: qty 2

## 2018-02-27 MED ORDER — MECLIZINE HCL 25 MG PO TABS: 25.0000 mg | ORAL_TABLET | Freq: Three times a day (TID) | ORAL | 0 refills | Status: AC | PRN

## 2018-02-27 MED ORDER — MECLIZINE HCL 25 MG PO TABS
25.0000 mg | ORAL_TABLET | Freq: Three times a day (TID) | ORAL | 0 refills | Status: AC | PRN
Start: 2018-02-27 — End: ?

## 2018-02-27 MED ORDER — MECLIZINE HCL 25 MG PO TABS
50.0000 mg | ORAL_TABLET | Freq: Once | ORAL | Status: AC
  Administered 2018-02-27: 50 mg via ORAL
  Filled 2018-02-27: qty 2
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
## 2018-02-27 NOTE — ED Notes (Signed)
D/c reviewed with patient-encouraged to follow up as ordered

## 2018-02-27 NOTE — ED Notes (Signed)
Patient reports hitting his head, he reports feeling dizzy
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
## 2018-06-09 ENCOUNTER — Other Ambulatory Visit: Payer: Self-pay

## 2018-06-09 ENCOUNTER — Encounter (HOSPITAL_COMMUNITY): Payer: Self-pay | Admitting: Emergency Medicine

## 2018-06-09 ENCOUNTER — Emergency Department (HOSPITAL_COMMUNITY): Payer: Self-pay

## 2018-06-09 ENCOUNTER — Emergency Department (HOSPITAL_COMMUNITY)
Admission: EM | Admit: 2018-06-09 | Discharge: 2018-06-09 | Disposition: A | Discharge status: Home / Self Care | Payer: Self-pay | Attending: Emergency Medicine | Admitting: Emergency Medicine

## 2018-06-09 DIAGNOSIS — S93491A Sprain of other ligament of right ankle, initial encounter: Secondary | ICD-10-CM | POA: Insufficient documentation

## 2018-06-09 DIAGNOSIS — Y9389 Activity, other specified: Secondary | ICD-10-CM | POA: Insufficient documentation

## 2018-06-09 DIAGNOSIS — Y929 Unspecified place or not applicable: Secondary | ICD-10-CM | POA: Insufficient documentation

## 2018-06-09 DIAGNOSIS — X500XXA Overexertion from strenuous movement or load, initial encounter: Secondary | ICD-10-CM | POA: Insufficient documentation

## 2018-06-09 DIAGNOSIS — Y999 Unspecified external cause status: Secondary | ICD-10-CM | POA: Insufficient documentation

## 2018-06-09 NOTE — ED Triage Notes (Signed)
Pt presents to the ED with complaints of a right ankle injury. Patient was going down the stairs and missed the bottom step and landed on his ankle. Ankle is swollen. Patient states that he is unable to bear weight or wiggle his toes. Pulse is good.

## 2018-06-09 NOTE — ED Provider Notes (Signed)
[facility]-EMERGENCY DEPT Provider Note  CSN: [id number] Arrival date & time: 06/09/18 0503  Chief Complaint(s) Ankle Injury  HPI Paul Najjarerence Koy is a 32 y.o. male who presents to the emergency department with right ankle pain after missing a step while walking on the stairs.  Patient reports rolling his ankle and feeling immediate pain.  Pain is exacerbated with ambulation, range of motion and palpation of the ankle.  He endorses swelling to the lateral aspect of the ankle.  Denies any other trauma or physical complaints.  HPI  Past Medical History History reviewed. No pertinent past medical history. There are no active problems to display for this patient.  Home Medication(s) Prior to Admission medications   Medication Sig Start Date End Date Taking? Authorizing Provider  ibuprofen (ADVIL,MOTRIN) 800 MG tablet Take 1 tablet (800 mg total) by mouth every 8 (eight) hours as needed for mild pain or moderate pain. Patient not taking: Reported on 06/18/2015 02/01/15   Trixie DredgeWest, Emily, PA-C  meclizine (ANTIVERT) 25 MG tablet Take 1 tablet (25 mg total) by mouth 3 (three) times daily as needed for dizziness. Patient not taking: Reported on 06/09/2018 02/27/18   Derwood KaplanNanavati, Ankit, MD  meclizine (ANTIVERT) 25 MG tablet Take 1 tablet (25 mg total) by mouth 3 (three) times daily as needed for dizziness. Patient not taking: Reported on 06/09/2018 02/27/18   Derwood KaplanNanavati, Ankit, MD  meloxicam (MOBIC) 15 MG tablet Take 1 tablet (15 mg total) daily by mouth. Patient not taking: Reported on 02/27/2018 09/07/17   Aviva KluverMurray, Alyssa B, PA-C  methocarbamol (ROBAXIN) 500 MG tablet Take 1 tablet (500 mg total) every 8 (eight) hours as needed by mouth for muscle spasms. Patient not taking: Reported on 02/27/2018 09/07/17   Aviva KluverMurray, Alyssa B, PA-C  ondansetron (ZOFRAN) 4 MG tablet Take 1 tablet (4 mg total) by mouth every 6 (six) hours. Patient not taking: Reported on 02/27/2018 08/21/17   Georgetta HaberBurky, Natalie B, NP                                                                                                                                    Past Surgical History History reviewed. No pertinent surgical history. Family History No family history on file.  Social History Social History   Tobacco Use  . Smoking status: Never Smoker  . Smokeless tobacco: Never Used  Substance Use Topics  . Alcohol use: Yes    Comment: occasional  . Drug use: No   Allergies Patient has no known allergies.  Review of Systems Review of Systems As noted in HPI. Physical Exam Vital Signs  I have reviewed the triage vital signs BP 126/88 (BP Location: Right Arm)   Pulse 95   Temp 98.9 F (37.2 C) (Oral)   Resp 18   SpO2 96%   Physical Exam  Constitutional: He is oriented to person, place, and time. He appears well-developed and well-nourished. No distress.  HENT:  Head: Normocephalic and atraumatic.  Right Ear: External ear normal.  Left Ear: External ear normal.  Nose: Nose normal.  Mouth/Throat: Mucous membranes are normal. No trismus in the jaw.  Eyes: Conjunctivae and EOM are normal. No scleral icterus.  Neck: Normal range of motion and phonation normal.  Cardiovascular: Normal rate and regular rhythm.  Pulmonary/Chest: Effort normal. No stridor. No respiratory distress.  Abdominal: He exhibits no distension.  Musculoskeletal: Normal range of motion. He exhibits no edema.       Right ankle: He exhibits swelling. Tenderness. Lateral malleolus and AITFL tenderness found. No medial malleolus tenderness found.  Neurological: He is alert and oriented to person, place, and time.  Skin: He is not diaphoretic.  Psychiatric: He has a normal mood and affect. His behavior is normal.  Vitals reviewed.   ED Results and Treatments Labs (all labs ordered are listed, but only abnormal results are displayed) Labs Reviewed - No data to display                                                                                                                        EKG  EKG Interpretation  Date/Time:    Ventricular Rate:    PR Interval:    QRS Duration:   QT Interval:    QTC Calculation:   R Axis:     Text Interpretation:        Radiology Dg Ankle Complete Right  Result Date: 06/09/2018 CLINICAL DATA:  Status post fall, with acute onset of right ankle pain. Unable to bear weight. Initial encounter. EXAM: RIGHT ANKLE - COMPLETE 3+ VIEW COMPARISON:  Right foot radiographs performed 09/30/2015 FINDINGS: There is no evidence of fracture or dislocation. A tiny osseous fragment at the distal fibula may reflect remote injury. The ankle mortise is intact; the interosseous space is within normal limits. No talar tilt or subluxation is seen. The joint spaces are preserved. Mild lateral soft tissue swelling is noted. IMPRESSION: No evidence of fracture or dislocation. Electronically Signed   By: Roanna Raider M.D.   On: 06/09/2018 05:30   Pertinent labs & imaging results that were available during my care of the patient were reviewed by me and considered in my medical decision making (see chart for details).  Medications Ordered in ED Medications - No data to display  Procedures Procedures  (including critical care time)  Medical Decision Making / ED Course I have reviewed the nursing notes for this encounter and the patient's prior records (if available in EHR or on provided paperwork).    Difficulty bearing weight.  Plain film negative for acute fracture.  Appears to be ankle sprain.  Provided with ASO.  The patient is safe for discharge with strict return precautions.   Final Clinical Impression(s) / ED Diagnoses Final diagnoses:  Sprain of anterior talofibular ligament of right ankle, initial encounter   Disposition: Discharge  Condition: Good  I have discussed the  results, Dx and Tx plan with the patient who expressed understanding and agree(s) with the plan. Discharge instructions discussed at great length. The patient was given strict return precautions who verbalized understanding of the instructions. No further questions at time of discharge.    ED Discharge Orders    None       Follow Up: Primary care provider  Schedule an appointment as soon as possible for a visit  As needed      This chart was dictated using voice recognition software.  Despite best efforts to proofread,  errors can occur which can change the documentation meaning.   Nira Conn, MD 06/09/18 [phone]

## 2018-06-09 NOTE — Discharge Instructions (Signed)
You may take over-the-counter Tylenol or Motrin for pain

## 2018-08-05 ENCOUNTER — Encounter (HOSPITAL_COMMUNITY): Payer: Self-pay | Admitting: Emergency Medicine

## 2018-08-05 ENCOUNTER — Emergency Department (HOSPITAL_COMMUNITY): Payer: Self-pay

## 2018-08-05 ENCOUNTER — Other Ambulatory Visit: Payer: Self-pay

## 2018-08-05 ENCOUNTER — Emergency Department (HOSPITAL_COMMUNITY)
Admission: EM | Admit: 2018-08-05 | Discharge: 2018-08-05 | Disposition: A | Discharge status: Home / Self Care | Payer: Self-pay | Attending: Emergency Medicine | Admitting: Emergency Medicine

## 2018-08-05 DIAGNOSIS — Y929 Unspecified place or not applicable: Secondary | ICD-10-CM | POA: Insufficient documentation

## 2018-08-05 DIAGNOSIS — W228XXA Striking against or struck by other objects, initial encounter: Secondary | ICD-10-CM | POA: Insufficient documentation

## 2018-08-05 DIAGNOSIS — Y939 Activity, unspecified: Secondary | ICD-10-CM | POA: Insufficient documentation

## 2018-08-05 DIAGNOSIS — M79642 Pain in left hand: Secondary | ICD-10-CM

## 2018-08-05 DIAGNOSIS — Y999 Unspecified external cause status: Secondary | ICD-10-CM | POA: Insufficient documentation

## 2018-08-05 DIAGNOSIS — S62339A Displaced fracture of neck of unspecified metacarpal bone, initial encounter for closed fracture: Secondary | ICD-10-CM | POA: Insufficient documentation

## 2018-08-05 MED ORDER — NAPROXEN 500 MG PO TABS: 500.0000 mg | ORAL_TABLET | Freq: Two times a day (BID) | ORAL | 0 refills | Status: AC

## 2018-08-05 MED ORDER — NAPROXEN 250 MG PO TABS
500.0000 mg | ORAL_TABLET | Freq: Once | ORAL | Status: AC
  Administered 2018-08-05: 500 mg via ORAL
  Filled 2018-08-05: qty 2

## 2018-08-05 NOTE — Progress Notes (Signed)
Orthopedic Tech Progress Note Patient Details:  Paul Cole Feb 01, 1986 [id number]  Ortho Devices Type of Ortho Device: Ace wrap, Short arm splint Ortho Device/Splint Interventions: Application   Post Interventions Patient Tolerated: Well Instructions Provided: Care of device   Saul Fordyce 08/05/2018, 6:23 PM

## 2018-08-05 NOTE — Discharge Instructions (Signed)
I have provided medication for your pain please take as directed.  You may also keep arm elevated while you are rest.  We have placed an ulnar gutter splint please keep the splint clean and dry.  If you experience any tingling sensation on your fingers, worsening symptoms you may return to the ER.  I have also given you a referral to an orthopedist please schedule an appointment on Monday.
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
## 2018-09-27 IMAGING — CR DG ANKLE COMPLETE 3+V*R*
3 series · 3 of 3 positions shown · non-contrast
Comparison: Right foot radiographs performed 09/30/2015

CLINICAL DATA: Status post fall, with acute onset of right ankle
pain. Unable to bear weight. Initial encounter.

EXAM:
RIGHT ANKLE - COMPLETE 3+ VIEW

[x ankle ap right]
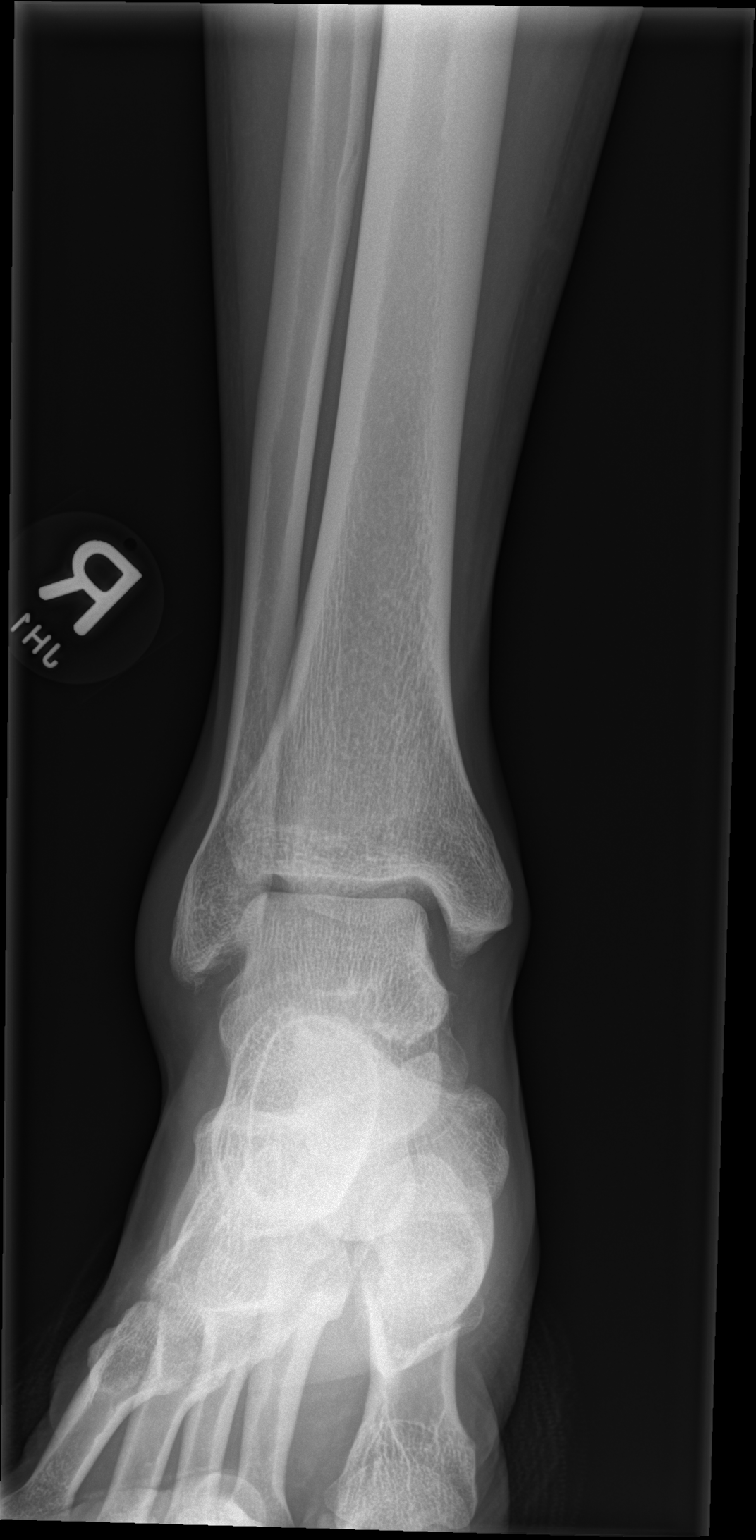

[x ankle obl right]
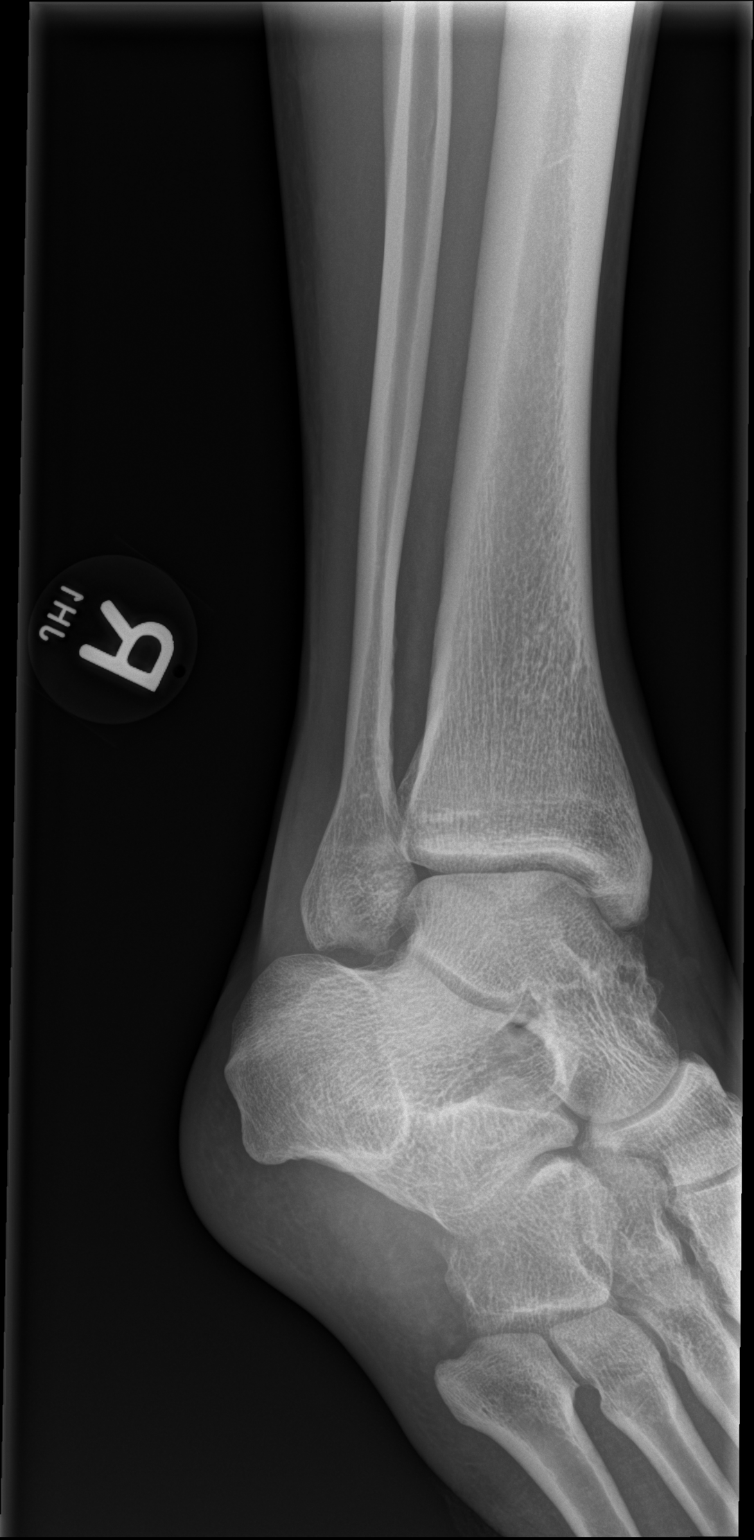

[x ankle lat right]
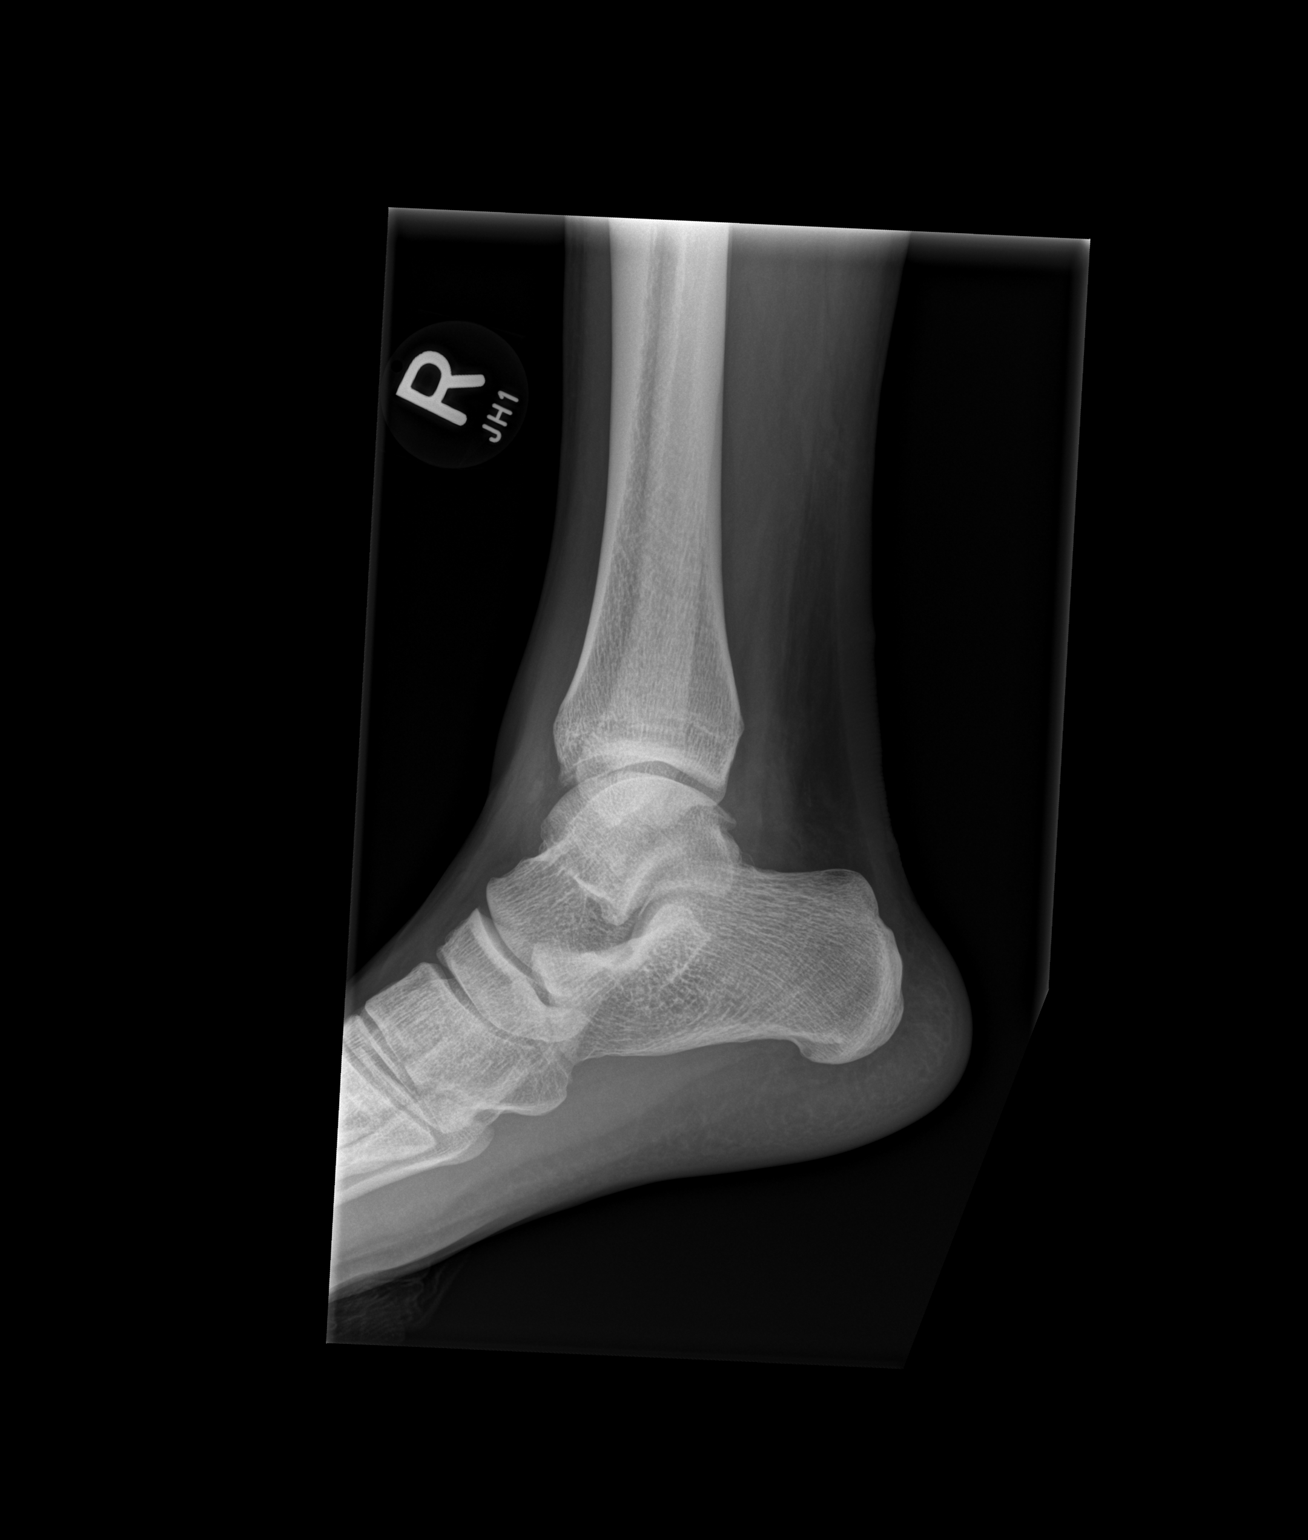

[3 of 3 positions shown; findings below may reference images not displayed]

FINDINGS: There is no evidence of fracture or dislocation. A tiny osseous
fragment at the distal fibula may reflect remote injury. The ankle
mortise is intact; the interosseous space is within normal limits.
No talar tilt or subluxation is seen.

The joint spaces are preserved. Mild lateral soft tissue swelling is
noted.
IMPRESSION: No evidence of fracture or dislocation.
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
## 2019-09-04 ENCOUNTER — Other Ambulatory Visit: Payer: Self-pay

## 2019-09-04 ENCOUNTER — Emergency Department (HOSPITAL_COMMUNITY): Admission: EM | Admit: 2019-09-04 | Discharge: 2019-09-04 | Discharge status: Absent without leave | Payer: Self-pay

## 2021-11-26 ENCOUNTER — Inpatient Hospital Stay
Admit: 2021-11-26 | Discharge: 2021-11-26 | Disposition: A | Discharge status: Home / Self Care | Attending: Emergency Medicine

## 2021-11-26 DIAGNOSIS — K648 Other hemorrhoids: Secondary | ICD-10-CM

## 2021-11-26 NOTE — ED Triage Notes (Signed)
Pt reports having rectal pain over the last three days that worsens with pressure. States he is unable to sit on buttocks without increase in pain. Reports having to strain for bowel movement three days ago when the pain began. Denies having pain similar to this in past and denies noting any other abnormalities to rectum. States before arriving to triage he was having RLQ abdominal pain but denies abdominal pain during triage. Denies noting blood in stool, urinary symptoms, n/v/d, fever, chills, or body aches.

## 2021-11-26 NOTE — ED Provider Notes (Signed)
Emergency Department Provider Note                   PCP:                None None               Age: 36 y.o.      Sex: male       ICD-10-CM    1. Internal hemorrhoids  K64.[facility] [place]          DISPOSITION Decision To Discharge 11/26/2021 04:39:35 PM       Medical Decision Making  This patient is a 36 year old male with no significant past medical history who presents today due to a painful rectum.  Physical exam with nurse present shows large reducible hemorrhoid without bleeding.  I discussed this case with general surgery and gave him a referral there for excision as needed.  We discussed conservative care measures in the meantime.  We also discussed return precautions.  Patient verbalized understanding and agreement with the plan.    Problems Addressed:  Internal hemorrhoids: acute illness or injury       Complexity of Problem: 1 stable, acute illness. (3)       ED Course as of 11/30/21 0842   Fri Nov 26, 2021   1536 Patient rectal exam with nurse present shows external hemorrhoid without bleeding. [KS]      ED Course User Index  [KS] Yetta FlockKendall Jelicia Nantz, PA        Orders Placed This Encounter   Procedures    [facility] [place]        Medications - No data to display    There are no discharge medications for this patient.       Adam Snyder is a 36 y.o. male who presents to the Emergency Department with chief complaint of    Chief Complaint   Patient presents with    Rectal Pain      This patient is a 36 year old male with no significant past medical history who presents today due to a painful rectum.  He says that the day before he was straining on the toilet when he suddenly felt immense pain in his rectum.  He denies foreign objects in his rectum.  He denies having sex with men.  He denies seeing blood with his bowel movement or when wiping.  Patient says that he has been very constipated lately.  He denies all other symptoms at this time.    The  history is provided by the patient. No language interpreter was used.      Review of Systems   Constitutional:  Negative for chills and fatigue.   HENT:  Negative for congestion, sinus pressure, sinus pain and sneezing.    Eyes:  Negative for visual disturbance.   Respiratory:  Negative for cough, chest tightness, shortness of breath and wheezing.    Cardiovascular:  Negative for chest pain and leg swelling.   Gastrointestinal:  Positive for constipation and rectal pain. Negative for abdominal distention, abdominal pain, anal bleeding, blood in stool, diarrhea, nausea and vomiting.   Genitourinary:  Negative for difficulty urinating, dysuria, flank pain and hematuria.   Musculoskeletal:  Negative for back pain, neck pain and neck stiffness.   Skin:  Negative for color change.   Neurological:  Negative for dizziness, light-headedness, numbness and headaches.   Psychiatric/Behavioral:  Negative for agitation and confusion.    All  other systems reviewed and are negative.    History reviewed. No pertinent past medical history.     History reviewed. No pertinent surgical history.     History reviewed. No pertinent family history.     Social History     Socioeconomic History    Marital status: Single     Spouse name: None    Number of children: None    Years of education: None    Highest education level: None        Allergies: Patient has no known allergies.    There are no discharge medications for this patient.       Vitals signs and nursing note reviewed.   No data found.         Physical Exam  Vitals and nursing note reviewed. Exam conducted with a chaperone present.   Constitutional:       General: He is not in acute distress.     Appearance: Normal appearance. He is normal weight. He is not ill-appearing, toxic-appearing or diaphoretic.   HENT:      Head: Normocephalic and atraumatic.      Nose: Nose normal.   Eyes:      General: No scleral icterus.        Right eye: No discharge.         Left eye: No discharge.       Extraocular Movements: Extraocular movements intact.      Conjunctiva/sclera: Conjunctivae normal.      Pupils: Pupils are equal, round, and reactive to light.   Cardiovascular:      Rate and Rhythm: Regular rhythm. Tachycardia present.      Pulses: Normal pulses.      Heart sounds: Normal heart sounds. No murmur heard.    No friction rub. No gallop.   Pulmonary:      Effort: Pulmonary effort is normal. No respiratory distress.      Breath sounds: No wheezing or rales.   Abdominal:      General: Abdomen is flat. There is no distension.      Palpations: Abdomen is soft.      Tenderness: There is no abdominal tenderness. There is no right CVA tenderness, left CVA tenderness or guarding.   Genitourinary:     Comments: Nurses present as chaperone.  Hemorrhoid noticed on rectal exam.  There are no anal fissures or areas of fluctuance suggesting abscess.  No blood was noted on exam.  No cellulitis, erythema, or other concerning lesions noted.  Musculoskeletal:         General: No swelling or tenderness. Normal range of motion.      Cervical back: Normal range of motion and neck supple. No rigidity or tenderness.   Skin:     General: Skin is warm and dry.      Capillary Refill: Capillary refill takes less than 2 seconds.      Coloration: Skin is not jaundiced or pale.      Findings: No bruising, erythema, lesion or rash.   Neurological:      General: No focal deficit present.      Mental Status: He is alert and oriented to person, place, and time. Mental status is at baseline.      Sensory: No sensory deficit.      Motor: No weakness.   Psychiatric:         Mood and Affect: Mood normal.         Behavior: Behavior normal.  Thought Content: Thought content normal.         Judgment: Judgment normal.        Procedures        No results found for any visits on 11/26/21.     No orders to display                         Voice dictation software was used during the making of this note.  This software is not perfect and  grammatical and other typographical errors may be present.  This note has not been completely proofread for errors.       Yetta Flock, [place]  11/30/21 [phone]

## 2021-11-26 NOTE — Discharge Instructions (Signed)
Please follow-up with general surgery for further evaluation of your hemorrhoids.  They may need to be surgically removed.    Please use over-the-counter hydrocortisone to the area to help decrease pain.  You may also use try "sits baths "when you fill the bathtub of warm water and sit in it to help relieve the pain of the hemorrhoid.  Also, please use MiraLAX daily to help loosen your stools.  You may use over-the-counter pain medication such as Tylenol or ibuprofen with your pain.  If you have any new or worsening symptoms, please return here for further evaluation.    We would love to help you get a primary care doctor for follow-up after your emergency department visit.    Please call [phone] between 7AM - 6PM Monday to Friday.  A care navigator will be able to assist you with setting up a doctor close to your home.

## 2021-11-26 NOTE — ED Notes (Signed)
Pt left before receiving d/c instructions.        Deforest Hoyles, RN  11/26/21 [phone]

## 2022-02-26 ENCOUNTER — Emergency Department: Admit: 2022-02-26

## 2022-02-26 ENCOUNTER — Inpatient Hospital Stay
Admit: 2022-02-26 | Discharge: 2022-02-26 | Disposition: A | Discharge status: Home / Self Care | Attending: Emergency Medicine

## 2022-02-26 DIAGNOSIS — S20211A Contusion of right front wall of thorax, initial encounter: Secondary | ICD-10-CM

## 2022-02-26 MED ORDER — METHOCARBAMOL 750 MG PO TABS
750 MG | ORAL_TABLET | Freq: Three times a day (TID) | ORAL | 0 refills | Status: AC
Start: 2022-02-26 — End: 2022-03-08

## 2022-02-26 MED ORDER — HYDROCODONE-ACETAMINOPHEN 5-325 MG PO TABS
5-325 MG | ORAL | Status: AC
Start: 2022-02-26 — End: 2022-02-26
  Administered 2022-02-26: 20:00:00 1 via ORAL

## 2022-02-26 MED ORDER — IBUPROFEN 800 MG PO TABS
800 MG | ORAL_TABLET | Freq: Two times a day (BID) | ORAL | 0 refills | Status: AC | PRN
Start: 2022-02-26 — End: 2023-01-02

## 2022-02-26 MED ORDER — ONDANSETRON 4 MG PO TBDP
4 MG | ORAL | Status: AC
Start: 2022-02-26 — End: 2022-02-26
  Administered 2022-02-26: 20:00:00 4 mg via ORAL

## 2022-02-26 MED FILL — ONDANSETRON 4 MG PO TBDP: 4 MG | ORAL | Qty: 1

## 2022-02-26 MED FILL — HYDROCODONE-ACETAMINOPHEN 5-325 MG PO TABS: 5-325 MG | ORAL | Qty: 1

## 2022-02-26 NOTE — ED Triage Notes (Signed)
Fall from stairs- (6 steps) right side pain. Denies head injury.

## 2022-02-26 NOTE — Discharge Instructions (Signed)
Ice to all sore ares, use meds as directed, see your primary md office for recheck    We would love to help you get a primary care doctor for follow-up after your emergency department visit.    Please call [phone] between 7AM - 6PM Monday to Friday.  A care navigator will be able to assist you with setting up a doctor close to your home.

## 2022-02-26 NOTE — ED Notes (Signed)
I have reviewed discharge instructions with the patient.  The patient verbalized understanding.    Patient left ED via Discharge Method: ambulatory to Home with self.    Opportunity for questions and clarification provided.       Patient given 2 scripts.         To continue your aftercare when you leave the hospital, you may receive an automated call from our care team to check in on how you are doing.  This is a free service and part of our promise to provide the best care and service to meet your aftercare needs." If you have questions, or wish to unsubscribe from this service please call [phone].  Thank you for Choosing our [facility] Emergency Department.        Aggie Hacker, RN  02/26/22 1754

## 2022-02-26 NOTE — ED Provider Notes (Signed)
Emergency Department Provider Note       PCP: None None   Age: 36 y.o.   Sex: male     DISPOSITION Decision To Discharge 02/26/2022 05:41:55 PM       ICD-10-CM    1. Contusion of right chest wall, initial encounter  S20.211A           Medical Decision Making     Complexity of Problems Addressed:  1 acute injury    Data Reviewed and Analyzed:  Category 1:   I independently ordered and reviewed each unique test.  I reviewed external records: provider visit note from outside specialist.       Category 2:   r rt rib x rays negative     Category 3: Discussion of management or test interpretation.  88 your male s/p fall, ribs and chest x rays negative for acute process, pt given single dose norco and zofran in er, pt also ate food  and drink, will treat chest contusion with motrin and robaxin, see pmd for recheck       Risk of Complications and/or Morbidity of Patient Management:  Prescription drug management performed.         History      Adam Snyder is a 36 y.o. male who presents to the Emergency Department with chief complaint of    Chief Complaint   Patient presents with    Fall      Patient to ER complaining of right lateral rib pain status post fall.  Patient states that he fell down the steps at his apartment last night he had pain to the right rib area worse this morning.  He is rested today with minimal relief.  He has not taken any over-the-counter medications.  He denies any fever no cough no previous injuries.  He denies head injury no neck pain    No past medical history on file.     No past surgical history on file.          Review of Systems   All other systems reviewed and are negative.    Physical Exam     Vitals signs and nursing note reviewed:  Patient Vitals for the past 4 hrs:   Temp Pulse Resp BP SpO2   02/26/22 1530 98.1 F (36.7 C) 82 18 111/74 98 %          Physical Exam  Vitals reviewed.   Constitutional:       Appearance: Normal appearance. He is normal weight.   HENT:      Head:  Normocephalic and atraumatic.      Right Ear: External ear normal.      Left Ear: External ear normal.      Nose: Nose normal.      Mouth/Throat:      Mouth: Mucous membranes are moist.      Pharynx: Oropharynx is clear.   Eyes:      Extraocular Movements: Extraocular movements intact.      Conjunctiva/sclera: Conjunctivae normal.      Pupils: Pupils are equal, round, and reactive to light.   Cardiovascular:      Rate and Rhythm: Normal rate and regular rhythm.   Pulmonary:      Effort: Pulmonary effort is normal.      Comments: Right lateral rib area without obvious abrasions.  Patient has tenderness to the right lateral lower rib area no crepitus appreciated lungs are clear but increased pain with deep inspiration  Chest:      Chest wall: Tenderness present.   Abdominal:      General: Abdomen is flat. Bowel sounds are normal.      Palpations: Abdomen is soft.      Tenderness: There is no abdominal tenderness.   Musculoskeletal:         General: Normal range of motion.      Cervical back: Normal range of motion and neck supple.   Skin:     General: Skin is warm and dry.   Neurological:      General: No focal deficit present.      Mental Status: He is alert and oriented to person, place, and time.   Psychiatric:         Mood and Affect: Mood normal.         Behavior: Behavior normal.        Procedures     Procedures    Orders Placed This Encounter   Procedures    XR RIBS RIGHT INCLUDE CHEST (MIN 3 VIEWS)        Medications   HYDROcodone-acetaminophen (NORCO) 5-325 MG per tablet 1 tablet (1 tablet Oral Given 02/26/22 1558)   ondansetron (ZOFRAN-ODT) disintegrating tablet 4 mg (4 mg Oral Given 02/26/22 1558)       New Prescriptions    IBUPROFEN (ADVIL;MOTRIN) 800 MG TABLET    Take 1 tablet by mouth 2 times daily as needed for Pain    METHOCARBAMOL (ROBAXIN-750) 750 MG TABLET    Take 1 tablet by mouth 3 times daily for 10 days        No past medical history on file.     No past surgical history on file.     Social  History     Socioeconomic History    Marital status: Single        Previous Medications    No medications on file        Results for orders placed or performed during the hospital encounter of 02/26/22   XR RIBS RIGHT INCLUDE CHEST (MIN 3 VIEWS)    Narrative    EXAMINATION: XR RIBS RIGHT INCLUDE CHEST (MIN 3 VIEWS)      DATE OF EXAM: 02/26/2022 4:00 PM    HISTORY: pain s/p fall     COMPARISON: None.    FINDINGS:     No acute fracture or focal destruction. Normal alignment. Joint spaces   maintained. No soft tissue abnormality.        Impression    1.  No acute displaced rib fracture.       Thank you for the referral of this patient. This exam was interpreted by an   Biomedical engineer of Radiology certified radiologist with subspecialty fellowship   training in body imaging. If there are any questions regarding this exam please   feel free to contact a radiologist directly at [phone].     Carmelina Peal, M.D.   02/26/2022 5:27:00 PM        XR RIBS RIGHT INCLUDE CHEST (MIN 3 VIEWS)   Final Result      1.  No acute displaced rib fracture.          Thank you for the referral of this patient. This exam was interpreted by an    Biomedical engineer of Radiology certified radiologist with subspecialty fellowship    training in body imaging. If there are any questions regarding this exam please    feel free to contact a  radiologist directly at [phone].       Carmelina PealVeronica Sue, M.D.    02/26/2022 5:27:00 PM                        Voice dictation software was used during the making of this note.  This software is not perfect and grammatical and other typographical errors may be present.  This note has not been completely proofread for errors.      Andris FlurryStephanie W Anyeli Hockenbury, PA  02/26/22 1741       Andris FlurryStephanie W Adelena Desantiago, PA  02/26/22 1743

## 2022-04-13 ENCOUNTER — Inpatient Hospital Stay
Admit: 2022-04-13 | Discharge: 2022-04-14 | Disposition: A | Discharge status: Home / Self Care | Attending: Emergency Medicine

## 2022-04-13 ENCOUNTER — Emergency Department: Admit: 2022-04-13

## 2022-04-13 DIAGNOSIS — F411 Generalized anxiety disorder: Secondary | ICD-10-CM

## 2022-04-13 LAB — CBC WITH AUTO DIFFERENTIAL
Absolute Immature Granulocyte: 0 10*3/uL (ref 0.0–0.5)
Basophils %: 1 % (ref 0.0–2.0)
Basophils Absolute: 0.1 10*3/uL (ref 0.0–0.2)
Eosinophils %: 1 % (ref 0.5–7.8)
Eosinophils Absolute: 0.1 10*3/uL (ref 0.0–0.8)
Hematocrit: 43.2 % (ref 41.1–50.3)
Hemoglobin: 14.4 g/dL (ref 13.6–17.2)
Immature Granulocytes: 0 % (ref 0.0–5.0)
Lymphocytes %: 32 % (ref 13–44)
Lymphocytes Absolute: 1.9 10*3/uL (ref 0.5–4.6)
MCH: 31 PG (ref 26.1–32.9)
MCHC: 33.3 g/dL (ref 31.4–35.0)
MCV: 92.9 FL (ref 82–102)
MPV: 10.9 FL (ref 9.4–12.3)
Monocytes %: 8 % (ref 4.0–12.0)
Monocytes Absolute: 0.5 10*3/uL (ref 0.1–1.3)
Neutrophils %: 58 % (ref 43–78)
Neutrophils Absolute: 3.5 10*3/uL (ref 1.7–8.2)
Platelets: 272 10*3/uL (ref 150–450)
RBC: 4.65 M/uL (ref 4.23–5.6)
RDW: 12.3 % (ref 11.9–14.6)
WBC: 6.1 10*3/uL (ref 4.3–11.1)
nRBC: 0 10*3/uL (ref 0.0–0.2)

## 2022-04-13 LAB — MAGNESIUM: Magnesium: 2 mg/dL (ref 1.8–2.4)

## 2022-04-13 LAB — COMPREHENSIVE METABOLIC PANEL
ALT: 29 U/L (ref 12–65)
AST: 29 U/L (ref 15–37)
Albumin/Globulin Ratio: 1.2 (ref 0.4–1.6)
Albumin: 3.2 g/dL — ABNORMAL LOW (ref 3.5–5.0)
Alk Phosphatase: 50 U/L (ref 50–136)
Anion Gap: 5 mmol/L (ref 2–11)
BUN: 14 MG/DL (ref 6–23)
CO2: 27 mmol/L (ref 21–32)
Calcium: 8.2 MG/DL — ABNORMAL LOW (ref 8.3–10.4)
Chloride: 110 mmol/L (ref 101–110)
Creatinine: 1.1 MG/DL (ref 0.8–1.5)
Est, Glom Filt Rate: >60 mL/min/{1.73_m2} (ref >60)
Globulin: 2.7 g/dL — ABNORMAL LOW (ref 2.8–4.5)
Glucose: 88 mg/dL (ref 65–100)
Potassium: 4 mmol/L (ref 3.5–5.1)
Sodium: 142 mmol/L (ref 133–143)
Total Bilirubin: 0.7 MG/DL (ref 0.2–1.1)
Total Protein: 5.9 g/dL — ABNORMAL LOW (ref 6.3–8.2)

## 2022-04-13 LAB — TSH WITH REFLEX: TSH w Free Thyroid if Abnormal: 2.23 u[IU]/mL (ref 0.358–3.740)

## 2022-04-13 NOTE — ED Provider Notes (Signed)
Emergency Department Provider Note       PCP: None None   Age: 36 y.o.   Sex: male     DISPOSITION Decision To Discharge 04/13/2022 08:28:03 PM       ICD-10-CM    1. Anxiety state  F41.1           Medical Decision Making     Complexity of Problems Addressed:  1 or more chronic illnesses with a severe exacerbation or progression.    Data Reviewed and Analyzed:  Category 1:   I independently ordered and reviewed each unique test.  I reviewed external records: provider visit note from outside specialist.       Category 2:   I independently ordered and interpreted the ED EKG in the absence of a Cardiologist.    Rate: 63  EKG Interpretation: EKG Interpretation: sinus rhythm, no evidence of arrhythmia  ST Segments: Normal ST segments - NO STEMI  I interpreted the X-rays no acute.    Category 3: Discussion of management or test interpretation.  I suspect sx related to anxiety and stress.  Reviewed chart.  Has been seen for SI in past.  Will restart Zoloft and f/u with MH      Risk of Complications and/or Morbidity of Patient Management:  Prescription drug management performed.         Is this patient to be included in the SEP-1 core measure due to severe sepsis or septic shock? No Exclusion criteria - the patient is NOT to be included for SEP-1 Core Measure due to: Infection is not suspected      History      Adam Snyder is a 36 y.o. male who presents to the Emergency Department with chief complaint of    Chief Complaint   Patient presents with    Chest Pain      C/o  palpitations for a few seconds for several weeks.  Stopped taking zoloft because he felt like he didn't need it any longer.  Denies SI.  States [facility] with episodes.  Working 3 jobs.  Thinks sx are related to anxiety and stress.  No tobacco.  No etoh.  No drugs.      The history is provided by the patient.      Review of Systems    Physical Exam     Vitals signs and nursing note reviewed:  Vitals:    04/13/22 1830 04/13/22 1837 04/13/22 2047   BP: (!)  115/90  125/73   Pulse: 98  71   Resp:  18 18   Temp: 98.1 F (36.7 C)     TempSrc: Oral     SpO2: 98%  99%   Weight:  175 lb (79.4 kg)    Height:  '6\' 3"'$  (1.905 m)       Physical Exam  Vitals and nursing note reviewed.   Constitutional:       General: He is not in acute distress.     Appearance: Normal appearance. He is well-developed. He is not ill-appearing.   HENT:      Head: Normocephalic and atraumatic.   Cardiovascular:      Rate and Rhythm: Normal rate and regular rhythm.   Pulmonary:      Effort: Pulmonary effort is normal.      Breath sounds: Normal breath sounds.   Musculoskeletal:         General: Normal range of motion.      Cervical back: Normal range of motion  and neck supple.   Skin:     General: Skin is warm and dry.      Coloration: Skin is not jaundiced.   Neurological:      General: No focal deficit present.      Mental Status: He is alert.      Cranial Nerves: No cranial nerve deficit.   Psychiatric:         Mood and Affect: Mood normal.         Behavior: Behavior normal.        Procedures     Procedures    Orders Placed This Encounter   Procedures    XR CHEST (2 VW)    CMP    CBC with Auto Differential    Magnesium    TSH with Reflex        Medications - No data to display    New Prescriptions    SERTRALINE (ZOLOFT) 50 MG TABLET    Take 1 tablet by mouth daily        No past medical history on file.     No past surgical history on file.     Social History     Socioeconomic History    Marital status: Single        Previous Medications    IBUPROFEN (ADVIL;MOTRIN) 800 MG TABLET    Take 1 tablet by mouth 2 times daily as needed for Pain        Results for orders placed or performed during the hospital encounter of 04/13/22   XR CHEST (2 VW)    Narrative    EXAMINATION: XR CHEST (2 VW)      DATE OF EXAM: 04/13/2022 7:00 PM    HISTORY: shob    COMPARISON: February 26, 2022.    FINDINGS:    Lung Parenchyma:  No infiltrate. No edema. Lung parenchyma clear.    Pleura:  No pleural  effusion.    Cardiomediastinal contour: Cardiac silhouette is not enlarged. Mediastinal   contour unremarkable.    Bones:  Unremarkable.        Impression    1.  Normal.        Thank you for the referral of this patient. This exam was interpreted by an   Tax adviser of Radiology certified radiologist with subspecialty fellowship   in [place]. If there are questions about this or other reports you can   contact a radiologist directly at [phone]   You can also reach me directly   at [email or web address]              Flonnie Overman, M.D., [facility]   04/13/2022 8:06:00 PM   CMP   Result Value Ref Range    Sodium 142 133 - 143 mmol/L    Potassium 4.0 3.5 - 5.1 mmol/L    Chloride 110 101 - 110 mmol/L    CO2 27 21 - 32 mmol/L    Anion Gap 5 2 - 11 mmol/L    Glucose 88 65 - 100 mg/dL    BUN 14 6 - 23 MG/DL    Creatinine 1.10 0.8 - 1.5 MG/DL    Est, Glom Filt Rate >60 >60 ml/min/1.34m    Calcium 8.2 (L) 8.3 - 10.4 MG/DL    Total Bilirubin 0.7 0.2 - 1.1 MG/DL    ALT 29 12 - 65 U/L    AST 29 15 - 37 U/L    Alk Phosphatase 50 50 - 136 U/L  Total Protein 5.9 (L) 6.3 - 8.2 g/dL    Albumin 3.2 (L) 3.5 - 5.0 g/dL    Globulin 2.7 (L) 2.8 - 4.5 g/dL    Albumin/Globulin Ratio 1.2 0.4 - 1.6     CBC with Auto Differential   Result Value Ref Range    WBC 6.1 4.3 - 11.1 K/uL    RBC 4.65 4.23 - 5.6 M/uL    Hemoglobin 14.4 13.6 - 17.2 g/dL    Hematocrit 43.2 41.1 - 50.3 %    MCV 92.9 82 - 102 FL    MCH 31.0 26.1 - 32.9 PG    MCHC 33.3 31.4 - 35.0 g/dL    RDW 12.3 11.9 - 14.6 %    Platelets 272 150 - 450 K/uL    MPV 10.9 9.4 - 12.3 FL    nRBC 0.00 0.0 - 0.2 K/uL    Differential Type AUTOMATED      Neutrophils % 58 43 - 78 %    Lymphocytes % 32 13 - 44 %    Monocytes % 8 4.0 - 12.0 %    Eosinophils % 1 0.5 - 7.8 %    Basophils % 1 0.0 - 2.0 %    Immature Granulocytes 0 0.0 - 5.0 %    Neutrophils Absolute 3.5 1.7 - 8.2 K/UL    Lymphocytes Absolute 1.9 0.5 - 4.6 K/UL    Monocytes Absolute 0.5 0.1 - 1.3 K/UL    Eosinophils Absolute 0.1 0.0  - 0.8 K/UL    Basophils Absolute 0.1 0.0 - 0.2 K/UL    Absolute Immature Granulocyte 0.0 0.0 - 0.5 K/UL   Magnesium   Result Value Ref Range    Magnesium 2.0 1.8 - 2.4 mg/dL   TSH with Reflex   Result Value Ref Range    TSH w Free Thyroid if Abnormal 2.23 0.358 - 3.740 UIU/ML        XR CHEST (2 VW)   Final Result      1.  Normal.           Thank you for the referral of this patient. This exam was interpreted by an    Tax adviser of Radiology certified radiologist with subspecialty fellowship    in [place]. If there are questions about this or other reports you can    contact a radiologist directly at [phone]   You can also reach me directly    at [email or web address]                    Flonnie Overman, M.D., [facility]    04/13/2022 8:06:00 PM                        Voice dictation software was used during the making of this note.  This software is not perfect and grammatical and other typographical errors may be present.  This note has not been completely proofread for errors.     Kela Millin, MD  04/13/22 2053

## 2022-04-13 NOTE — ED Notes (Signed)
I have reviewed discharge instructions with the patient.  The patient verbalized understanding.    Patient left ED via Discharge Method: ambulatory to Home with (insert name of family/friend, self, transport self).    Opportunity for questions and clarification provided.       Patient given 1 scripts.         To continue your aftercare when you leave the hospital, you may receive an automated call from our care team to check in on how you are doing.  This is a free service and part of our promise to provide the best care and service to meet your aftercare needs." If you have questions, or wish to unsubscribe from this service please call [phone].  Thank you for Choosing our [facility] Emergency Department.        Gari Crown, RN  04/13/22 [phone]

## 2022-04-13 NOTE — ED Triage Notes (Addendum)
Patient ambulatory to triage with CO intermittent episodes of "feels like my heart is pounding" that last around 30 seconds at a time. Reports feeling "like im just gunna fall out" when it occurs. States "I dont know if its my heart or just anxiety" denies cardiac hx depression and anxiety.

## 2022-04-14 MED ORDER — SERTRALINE HCL 50 MG PO TABS
50 MG | ORAL_TABLET | Freq: Every day | ORAL | 0 refills | Status: DC
Start: 2022-04-14 — End: 2023-01-02

## 2022-06-01 ENCOUNTER — Inpatient Hospital Stay
Admit: 2022-06-01 | Discharge: 2022-06-01 | Disposition: A | Discharge status: Home / Self Care | Attending: Student in an Organized Health Care Education/Training Program

## 2022-06-01 DIAGNOSIS — T679XXA Effect of heat and light, unspecified, initial encounter: Secondary | ICD-10-CM

## 2022-06-01 LAB — COMPREHENSIVE METABOLIC PANEL
ALT: 27 U/L (ref 12–65)
AST: 24 U/L (ref 15–37)
Albumin/Globulin Ratio: 1.2 (ref 0.4–1.6)
Albumin: 3.2 g/dL — ABNORMAL LOW (ref 3.5–5.0)
Alk Phosphatase: 51 U/L (ref 50–136)
Anion Gap: 5 mmol/L (ref 2–11)
BUN: 10 MG/DL (ref 6–23)
CO2: 28 mmol/L (ref 21–32)
Calcium: 8.4 MG/DL (ref 8.3–10.4)
Chloride: 110 mmol/L (ref 101–110)
Creatinine: 1.1 MG/DL (ref 0.8–1.5)
Est, Glom Filt Rate: >60 mL/min/{1.73_m2} (ref >60)
Globulin: 2.7 g/dL — ABNORMAL LOW (ref 2.8–4.5)
Glucose: 82 mg/dL (ref 65–100)
Potassium: 4.2 mmol/L (ref 3.5–5.1)
Sodium: 143 mmol/L (ref 133–143)
Total Bilirubin: 0.4 MG/DL (ref 0.2–1.1)
Total Protein: 5.9 g/dL — ABNORMAL LOW (ref 6.3–8.2)

## 2022-06-01 LAB — CBC WITH AUTO DIFFERENTIAL
Absolute Immature Granulocyte: 0 10*3/uL (ref 0.0–0.5)
Basophils %: 1 % (ref 0.0–2.0)
Basophils Absolute: 0 10*3/uL (ref 0.0–0.2)
Eosinophils %: 1 % (ref 0.5–7.8)
Eosinophils Absolute: 0.1 10*3/uL (ref 0.0–0.8)
Hematocrit: 43.3 % (ref 41.1–50.3)
Hemoglobin: 14.4 g/dL (ref 13.6–17.2)
Immature Granulocytes: 0 % (ref 0.0–5.0)
Lymphocytes %: 40 % (ref 13–44)
Lymphocytes Absolute: 2 10*3/uL (ref 0.5–4.6)
MCH: 31 PG (ref 26.1–32.9)
MCHC: 33.3 g/dL (ref 31.4–35.0)
MCV: 93.1 FL (ref 82–102)
MPV: 10.6 FL (ref 9.4–12.3)
Monocytes %: 7 % (ref 4.0–12.0)
Monocytes Absolute: 0.4 10*3/uL (ref 0.1–1.3)
Neutrophils %: 51 % (ref 43–78)
Neutrophils Absolute: 2.5 10*3/uL (ref 1.7–8.2)
Platelets: 243 10*3/uL (ref 150–450)
RBC: 4.65 M/uL (ref 4.23–5.6)
RDW: 12.6 % (ref 11.9–14.6)
WBC: 4.9 10*3/uL (ref 4.3–11.1)
nRBC: 0 10*3/uL (ref 0.0–0.2)

## 2022-06-01 LAB — MAGNESIUM: Magnesium: 2 mg/dL (ref 1.8–2.4)

## 2022-06-01 LAB — ETHANOL: Ethanol Lvl: 22 MG/DL

## 2022-06-01 MED ORDER — ONDANSETRON HCL 4 MG/2ML IJ SOLN
4 MG/2ML | INTRAMUSCULAR | Status: AC
Start: 2022-06-01 — End: 2022-06-01
  Administered 2022-06-01: 21:00:00 4 mg via INTRAVENOUS

## 2022-06-01 MED ORDER — SODIUM CHLORIDE 0.9 % IV BOLUS
0.9 % | INTRAVENOUS | Status: AC
Start: 2022-06-01 — End: 2022-06-01
  Administered 2022-06-01: 21:00:00 1000 mL via INTRAVENOUS

## 2022-06-01 MED FILL — ONDANSETRON HCL 4 MG/2ML IJ SOLN: 4 MG/2ML | INTRAMUSCULAR | Qty: 2

## 2022-06-01 NOTE — Discharge Instructions (Signed)
Increase intake of clear, cool liquids to ensure hydration.  Eat regular, bland meals until symptoms improve.  Avoid alcohol use.  Return to this department for worsening symptoms, concerns or questions.    We would love to help you get a primary care doctor for follow-up after your emergency department visit.    Please call [phone] between 7AM - 6PM Monday to Friday.  A care navigator will be able to assist you with setting up a doctor close to your home.

## 2022-06-01 NOTE — ED Provider Notes (Signed)
Emergency Department Provider Note       PCP: None None   Age: 36 y.o.   Sex: male     DISPOSITION Decision To Discharge 06/01/2022 06:51:25 PM       ICD-10-CM    1. Heat exposure, initial encounter  T67.9XXA           Medical Decision Making     Complexity of Problems Addressed:  1 or more acute illnesses that pose a threat to life or bodily function.     Data Reviewed and Analyzed:  Category 1:   I independently ordered and reviewed each unique test.  I reviewed external records: ED visit note from an outside group.  I reviewed external records: provider visit note from PCP.  I reviewed external records: provider visit note from outside specialist.   The patients assessment required an independent historian: EMS providers.  The reason they were needed is important historical information not provided by the patient.    Category 2:       Category 3: Discussion of management or test interpretation.  Well-appearing male patient presenting to this department via EMS with concerns for dehydration after he was outside walking [place] and from [place].  He is well-appearing on arrival, stable vital signs per EMS.  We will check basic labs, EKG and give fluid bolus with Zofran as he reports some nausea.      Risk of Complications and/or Morbidity of Patient Management:  Shared medical decision making was utilized in creating the patients health plan today.    ED Course as of 06/04/22 0005   Wed Jun 01, 2022   1643 EKG interpretation: Sinus rhythm, rate of 69, normal axis, early repolarization present.  No reciprocal changes.  No obvious ischemic change. [BR]   8242 Per nursing staff, patient states he donated blood yesterday. [BR]      ED Course User Index  [BR] Debera Lat, DO       History      Adam Snyder is a 36 y.o. male who presents to the Emergency Department with chief complaint of    Chief Complaint   Patient presents with    Dehydration      36 year old male patient presenting via EMS with reports of  suspected dehydration.  Patient states has been ambulating outside to and from downtown.  Is been sweating profusely and states while seated he developed some lightheadedness and nausea.  He describes weak feeling all over states dizziness continues.  EMS reports stable vital signs in route.  Patient states he did eat earlier today and has been attempting to drink fluids however he suspects his being outside in the heat has led to his current symptoms.  He reports no chest pain pressure tightness, reports 1 episode of vomiting.  He denies any specific pain at this time.  Patient reports no significant past medical history.  No interventions undertaken prior to arrival.    The history is provided by the patient and the EMS personnel. No language interpreter was used.      Review of Systems   Constitutional:  Positive for diaphoresis and fatigue.   Gastrointestinal:  Positive for nausea and vomiting.   Neurological:  Positive for weakness and light-headedness.     Physical Exam     Vitals signs and nursing note reviewed:  Vitals:    06/01/22 1704 06/01/22 1744 06/01/22 1806 06/01/22 1814   BP: (!) 137/90 (!) 116/90 117/82 126/89   Pulse: 66 73  83 66   Resp: '18 14 18 11   '$ Temp:       TempSrc:       SpO2:       Weight:       Height:          Physical Exam  Vitals and nursing note reviewed.   Constitutional:       General: He is not in acute distress.     Appearance: Normal appearance. He is normal weight. He is not ill-appearing or toxic-appearing.      Comments: Very well-appearing, alert and oriented x4.  No acute distress, speaks in clear, fluid sentences.   HENT:      Head: Normocephalic and atraumatic.      Right Ear: External ear normal.      Left Ear: External ear normal.      Nose: Nose normal.      Mouth/Throat:      Mouth: Mucous membranes are moist.   Eyes:      General: No scleral icterus.        Right eye: No discharge.         Left eye: No discharge.      Extraocular Movements: Extraocular movements  intact.   Cardiovascular:      Rate and Rhythm: Normal rate and regular rhythm.      Pulses: Normal pulses.      Heart sounds: Normal heart sounds.   Pulmonary:      Effort: Pulmonary effort is normal. No tachypnea, bradypnea, accessory muscle usage, prolonged expiration or respiratory distress.      Breath sounds: Normal breath sounds and air entry. No stridor. No decreased breath sounds, wheezing, rhonchi or rales.   Abdominal:      General: Abdomen is flat. There is no distension.      Palpations: There is no mass.      Tenderness: There is no abdominal tenderness. There is no right CVA tenderness, left CVA tenderness, guarding or rebound. Negative signs include Murphy's sign and McBurney's sign.      Hernia: No hernia is present.   Musculoskeletal:         General: No swelling, tenderness or deformity. Normal range of motion.      Cervical back: Normal range of motion.   Skin:     General: Skin is warm.      Capillary Refill: Capillary refill takes less than 2 seconds.   Neurological:      General: No focal deficit present.      Mental Status: He is alert.   Psychiatric:         Mood and Affect: Mood normal.        Procedures     Procedures    Orders Placed This Encounter   Procedures    CBC with Auto Differential    Comprehensive Metabolic Panel    Magnesium    Alcohol    Vital signs    EKG 12 Lead        Medications   sodium chloride 0.9 % bolus 1,000 mL (0 mLs IntraVENous Stopped 06/01/22 1757)   ondansetron (ZOFRAN) injection 4 mg (4 mg IntraVENous Given 06/01/22 1652)       Discharge Medication List as of 06/01/2022  6:51 PM           No past medical history on file.     No past surgical history on file.     Social History     Socioeconomic  History    Marital status: Single        Discharge Medication List as of 06/01/2022  6:51 PM        CONTINUE these medications which have NOT CHANGED    Details   sertraline (ZOLOFT) 50 MG tablet Take 1 tablet by mouth daily, Disp-30 tablet, R-0Print      ibuprofen  (ADVIL;MOTRIN) 800 MG tablet Take 1 tablet by mouth 2 times daily as needed for Pain, Disp-60 tablet, R-0Print              Results for orders placed or performed during the hospital encounter of 06/01/22   CBC with Auto Differential   Result Value Ref Range    WBC 4.9 4.3 - 11.1 K/uL    RBC 4.65 4.23 - 5.6 M/uL    Hemoglobin 14.4 13.6 - 17.2 g/dL    Hematocrit 43.3 41.1 - 50.3 %    MCV 93.1 82 - 102 FL    MCH 31.0 26.1 - 32.9 PG    MCHC 33.3 31.4 - 35.0 g/dL    RDW 12.6 11.9 - 14.6 %    Platelets 243 150 - 450 K/uL    MPV 10.6 9.4 - 12.3 FL    nRBC 0.00 0.0 - 0.2 K/uL    Differential Type AUTOMATED      Neutrophils % 51 43 - 78 %    Lymphocytes % 40 13 - 44 %    Monocytes % 7 4.0 - 12.0 %    Eosinophils % 1 0.5 - 7.8 %    Basophils % 1 0.0 - 2.0 %    Immature Granulocytes 0 0.0 - 5.0 %    Neutrophils Absolute 2.5 1.7 - 8.2 K/UL    Lymphocytes Absolute 2.0 0.5 - 4.6 K/UL    Monocytes Absolute 0.4 0.1 - 1.3 K/UL    Eosinophils Absolute 0.1 0.0 - 0.8 K/UL    Basophils Absolute 0.0 0.0 - 0.2 K/UL    Absolute Immature Granulocyte 0.0 0.0 - 0.5 K/UL   Comprehensive Metabolic Panel   Result Value Ref Range    Sodium 143 133 - 143 mmol/L    Potassium 4.2 3.5 - 5.1 mmol/L    Chloride 110 101 - 110 mmol/L    CO2 28 21 - 32 mmol/L    Anion Gap 5 2 - 11 mmol/L    Glucose 82 65 - 100 mg/dL    BUN 10 6 - 23 MG/DL    Creatinine 1.10 0.8 - 1.5 MG/DL    Est, Glom Filt Rate >60 >60 ml/min/1.40m    Calcium 8.4 8.3 - 10.4 MG/DL    Total Bilirubin 0.4 0.2 - 1.1 MG/DL    ALT 27 12 - 65 U/L    AST 24 15 - 37 U/L    Alk Phosphatase 51 50 - 136 U/L    Total Protein 5.9 (L) 6.3 - 8.2 g/dL    Albumin 3.2 (L) 3.5 - 5.0 g/dL    Globulin 2.7 (L) 2.8 - 4.5 g/dL    Albumin/Globulin Ratio 1.2 0.4 - 1.6     Magnesium   Result Value Ref Range    Magnesium 2.0 1.8 - 2.4 mg/dL   Alcohol   Result Value Ref Range    Ethanol Lvl 22 MG/DL        No orders to display                     Voice dictation software was  used during the making of this note.   This software is not perfect and grammatical and other typographical errors may be present.  This note has not been completely proofread for errors.        [place], DO  06/04/22 0005

## 2022-06-01 NOTE — ED Triage Notes (Signed)
Coming from sidewalk via ems. Stated he felt weak after walking around downtown. Stated he felt dehydrated and weak. No other complaints.   HR 67  BP 124/88  Bg 111  100% RA

## 2022-06-01 NOTE — Discharge Summary (Signed)
Pt stable for DC, VSS, IV removed. Orders completed. Patient provided with DC instructions - expresses understanding. DC home. Pt requesting ride home.

## 2022-06-11 ENCOUNTER — Emergency Department: Admit: 2022-06-12

## 2022-06-11 ENCOUNTER — Inpatient Hospital Stay
Admit: 2022-06-11 | Discharge: 2022-06-12 | Disposition: A | Discharge status: Home / Self Care | Attending: Emergency Medicine

## 2022-06-11 DIAGNOSIS — M25571 Pain in right ankle and joints of right foot: Secondary | ICD-10-CM

## 2022-06-11 NOTE — ED Notes (Signed)
I have reviewed discharge instructions with the patient.  The patient verbalized understanding.    Patient left ED via Discharge Method: ambulatory to Home with family.    Opportunity for questions and clarification provided.       Patient given 1 scripts.         To continue your aftercare when you leave the hospital, you may receive an automated call from our care team to check in on how you are doing.  This is a free service and part of our promise to provide the best care and service to meet your aftercare needs." If you have questions, or wish to unsubscribe from this service please call [phone].  Thank you for Choosing our [facility] Emergency Department.        Thurnell Lose, RN  06/11/22 [phone]

## 2022-06-11 NOTE — ED Triage Notes (Signed)
Patient arrives to the ER with c/o R. Lower leg pain that is a result of a sports related injuy that happened yesterday. Pt states waking up this morning not being able to bare weight on the the extremity and having tingling in the toes of the right foot.

## 2022-06-11 NOTE — Discharge Instructions (Addendum)
Please use walking boot and crutches.  X-rays drink ibuprofen to help with pain.  Perform ankle exercises as listed on this document.  Please follow-up with orthopedics for follow-up appointment.

## 2022-06-11 NOTE — ED Provider Notes (Incomplete)
Emergency Department Provider Note       PCP: None None   Age: 36 y.o.   Sex: male     DISPOSITION       No diagnosis found.    Medical Decision Making     Complexity of Problems Addressed:  {Complexity:56549}    Data Reviewed and Analyzed:  I independently ordered and reviewed each unique test.  {external source:53765}   {Historian (state who, why needed, what they said):56592}    {test reviewed:53725}    Discussion of management or test interpretation.  ***  {Admitted or Consultants involved.:58337}    Risk of Complications and/or Morbidity of Patient Management:  {ZOXW:96045}         History      Adam Snyder is a 36 y.o. male who presents to the Emergency Department with chief complaint of    Chief Complaint   Patient presents with   . Leg Pain      35 year old male with no known past medical history presents for right ankle pain after playing basketball yesterday.  Reports that he came down hard on his right foot, states that he felt pain shooting up the back of his leg.  Reports that he has been able to ambulate with pain.  Reports tingling to right side of his foot.         Review of Systems   Musculoskeletal:  Positive for arthralgias.   All other systems reviewed and are negative.    Physical Exam     Vitals signs and nursing note reviewed:  Vitals:    06/11/22 1853   BP: 124/86   Pulse: 65   Resp: 15   Temp: 98.1 F (36.7 C)   SpO2: 96%      Physical Exam  Musculoskeletal:         General: Tenderness present. No swelling or deformity. Normal range of motion.        Feet:         Procedures     Procedures    Orders Placed This Encounter   Procedures   . XR ANKLE RIGHT (MIN 3 VIEWS)        Medications given during this emergency department visit:  Medications   ketorolac (TORADOL) injection 15 mg (has no administration in time range)       New Prescriptions   New Prescriptions    No medications on file        No past medical history on file.     No past surgical history on file.     Social History      Socioeconomic History   . Marital status: Single        Previous Medications    IBUPROFEN (ADVIL;MOTRIN) 800 MG TABLET    Take 1 tablet by mouth 2 times daily as needed for Pain    SERTRALINE (ZOLOFT) 50 MG TABLET    Take 1 tablet by mouth daily        No results found for any visits on 06/11/22.     XR ANKLE RIGHT (MIN 3 VIEWS)    (Results Pending)                     Voice dictation software was used during the making of this note.  This software is not perfect and grammatical and other typographical errors may be present.  This note has not been completely proofread for errors.

## 2022-06-11 NOTE — ED Notes (Signed)
RN educated pt on applying and removing walking boot and RN educated pt on using crutches.     Thurnell Lose, RN  06/11/22 [phone]

## 2022-06-11 NOTE — ED Notes (Signed)
Arranged rideshare service to transport patient home. ETA: 11:30 PM.     Carleene Cooper A Angelic Schnelle  06/11/22 2324

## 2022-06-12 MED ORDER — KETOROLAC TROMETHAMINE 30 MG/ML IJ SOLN
30 MG/ML | Freq: Once | INTRAMUSCULAR | Status: AC
Start: 2022-06-12 — End: 2022-06-11
  Administered 2022-06-12: 01:00:00 15 mg via INTRAMUSCULAR

## 2022-06-12 MED ORDER — IBUPROFEN 800 MG PO TABS
800 MG | ORAL_TABLET | Freq: Two times a day (BID) | ORAL | 0 refills | Status: DC | PRN
Start: 2022-06-12 — End: 2023-01-02

## 2022-06-12 MED FILL — KETOROLAC TROMETHAMINE 30 MG/ML IJ SOLN: 30 MG/ML | INTRAMUSCULAR | Qty: 1

## 2022-06-30 ENCOUNTER — Encounter: Attending: Orthopaedic Surgery

## 2022-07-14 ENCOUNTER — Inpatient Hospital Stay
Admit: 2022-07-14 | Discharge: 2022-07-14 | Disposition: A | Discharge status: Home / Self Care | Attending: General Practice

## 2022-07-14 DIAGNOSIS — J069 Acute upper respiratory infection, unspecified: Secondary | ICD-10-CM

## 2022-07-14 LAB — COVID-19, RAPID: SARS-CoV-2, Rapid: NOT DETECTED

## 2022-07-14 LAB — GROUP A STREP SCREEN BY PCR: Strep, Molecular: NOT DETECTED

## 2022-07-14 MED ORDER — ACETAMINOPHEN 500 MG PO TABS
500 MG | ORAL | Status: AC
Start: 2022-07-14 — End: 2022-07-14
  Administered 2022-07-14: 13:00:00 1000 mg via ORAL

## 2022-07-14 MED FILL — TYLENOL EXTRA STRENGTH 500 MG PO TABS: 500 MG | ORAL | Qty: 2

## 2022-07-14 NOTE — ED Notes (Signed)
I have reviewed discharge instructions with the patient.  The patient verbalized understanding.    Patient left ED via Discharge Method: ambulatory to Home with self    Opportunity for questions and clarification provided.       Patient given 0 scripts.         To continue your aftercare when you leave the hospital, you may receive an automated call from our care team to check in on how you are doing.  This is a free service and part of our promise to provide the best care and service to meet your aftercare needs." If you have questions, or wish to unsubscribe from this service please call [phone].  Thank you for Choosing our [facility] Emergency Department.        Barnett Abu, RN  07/14/22 [phone]

## 2022-07-14 NOTE — Discharge Instructions (Signed)
Take medications as discussed. Follow-up with recommended provider in the next 1-2 days.  Return to the ED immediately for any new, worsening, concerning symptoms; or for danger signs as discussed.

## 2022-07-14 NOTE — ED Triage Notes (Signed)
Pt reports 4-5days of worsening sore throat, congestion, cough. (-)dyspnea (+)subjective fever    A&Ox4

## 2022-07-14 NOTE — ED Provider Notes (Cosign Needed)
Emergency Department Provider Note       PCP: None None   Age: 36 y.o.   Sex: male     DISPOSITION     dc    ICD-10-CM    1. Upper respiratory tract infection, unspecified type  J06.9           Medical Decision Making     Complexity of Problems Addressed:  1 or more acute illnesses that pose a threat to life or bodily function.     Data Reviewed and Analyzed:  I independently ordered and reviewed each unique test.  I reviewed external records: provider visit note from outside specialist.       I interpreted the fluid COVID testing with negative result.    Discussion of management or test interpretation.  As in HPI.  Pleasant well-appearing.  Hemodynamic stable and afebrile.  No chest pain difficulty breathing.  No difficulty swallowing.  Pt neck is supple, no meningeal signs.  No rash, sore throat, nausea/vomiting or abdominal pain. No oropharyngeal edema/erythema/exudates.  Uvula midline, no visualized PTA, no throat pain with range of motion of neck, no tender or enlarged lymph nodes, lungs are clear to auscultation, no diminished sounds.  Abdomen is soft and not tender.  Denies urinary complaint. . Low suspicion of deep space infection, RPA/PTA, strep pharyngitis, influenza, encephalitis, meningitis, bacteremia, pneumonia, CA, myocarditis, PE/DVT, ACS, COPD exacerbation, other emergent process. Symptoms likely related to viral URI, with concern given for COVID-19 infection.    We will give Tylenol, swab for COVID and strep.    -Strep and negative COVID.  Likely viral URI.  Discussed therapeutic measures and given strict return precautions.  Patient is very well-hydrated appearing, no distress, pleasant and conversational.  Nontoxic-appearing, tolerating oral intake. Patient is hemodynamically stable and in no acute distress. Patient is not ill-appearing. Discussed patient with attending who reviewed the patient's results and collaborated on care planning. All findings and plans were discussed with the patient.  Patient verbalizes desire to be discharged home at this time. All questions answered. Discussed with the patient that an unremarkable evaluation in the ED does not preclude the development or presence of a serious or life threatening condition. Patient was instructed to return immediately for any worsening or change in current symptoms, or if symptoms do not continue to improve. I instructed them to follow up with their primary care provider, own specialist, or medical provider that I am recommending for him within the next 2-3 days     the patient acknowledged understanding plan of care and affirmed approval. Patient is discharged home, with no further complaint.             Risk of Complications and/or Morbidity of Patient Management:  Patient was discharged risks and benefits of hospitalization were considered.  Shared medical decision making was utilized in creating the patients health plan today.         History       HPI  Pleasant 36 year old male presents the ED with complaint of cough, nasal congestion and rhinorrhea and "scratchy throat," x3-4 days.  States he has had some chills.  Denies fever, change in appetite, weight loss, decreased oral intake, blurred vision, headaches, neck pain/stiffness, sore throat, hoarseness, drooling, difficulty swallowing, ear pain, chest pain or tightness, difficulty breathing, DOE, N/V/D, abdominal pain, generalized body aches, rash, urinary complaint, change in bowel habits.  States that she has not attempted any therapeutic measures prior to arrival.      Alert &  oriented x 3, afebrile, hemodynamically stable, non-toxic appearing, appears in no distress.   Medical/surgical/social history reviewed with the patient.                 Physical:                               Review of Systems  In HPI  Physical Exam     Vitals signs and nursing note reviewed:  Vitals:    07/14/22 0806   BP: 125/84   Pulse: 87   Resp: 17   Temp: 98.5 F (36.9 C)   TempSrc: Oral   SpO2: 96%    Weight: 175 lb (79.4 kg)   Height: 6\' 3"  (1.905 m)      Physical Exam   Constitutional: Oriented to person, place, and time. Appears well-developed and well-nourished. No distress.    HENT:    Head: Normocephalic and atraumatic. No tenderness/facial tenderness.   Right Ear: External ear normal. Non tender, no erythema/exudates.   Left Ear: External ear normal.  Non tender, no erythema/exudates.   Nose: Nose normal. Pink/moist mucosa.  Clear rhinorrhea.    Mouth/Throat: Mouth normal. Uvula midline, no erythema/exudates/edema. No drooling, no voice change, no tender/enlarged nodes, no swelling. No pain with ROM of neck.   Eyes: Conjunctivae are normal. Pupils are equal, round, and reactive to light.   Neck: Supple. No tracheal deviation. Not tender, not rigid, no menningeal signs.    Cardiovascular: Normal rate, intact distal pulses. Brisk capillary refill intact, less than 2 seconds. Regular rhythm present. No murmer, no friction rub heard.   Pulmonary/Chest: Lungs are clear & equal bilaterally. No diminished sounds, no adventitious sounds. No respiratory distress.    Abdominal: Soft. Normoactive bowel sounds. There is no tenderness/distension/rigidity. No flank/CVA tenderness.     Musculoskeletal: No obvious deformity, erythema, edema.   Neurological: Alert and oriented to person, place, and time. No numbness/tingling. No loss of sensation. Positive PMS 4. GCS= 15.    Skin: Skin is warm and dry. Capillary refill takes less than 2 seconds. No abrasion, no lesion, no petechiae and no rash noted. Not diaphoretic. No cyanosis, erythema, or pallor.   Psychiatric: Normal mood and affect. Behavior is normal.    Nursing note and vitals reviewed.   Procedures     Procedures    Orders Placed This Encounter   Procedures    Group A Strep Screen By PCR    COVID-19, Rapid        Medications given during this emergency department visit:  Medications - No data to display    New Prescriptions    No medications on file         History reviewed. No pertinent past medical history.     History reviewed. No pertinent surgical history.     Social History     Socioeconomic History    Marital status: Single     Spouse name: None    Number of children: None    Years of education: None    Highest education level: None   Tobacco Use    Smoking status: Never    Smokeless tobacco: Never   Substance and Sexual Activity    Alcohol use: Yes    Drug use: Never        Previous Medications    CYCLOPENTOLATE (CYCLOGYL) 1 % OPHTHALMIC SOLUTION        IBUPROFEN (ADVIL;MOTRIN) 800 MG TABLET  Take 1 tablet by mouth 2 times daily as needed for Pain    IBUPROFEN (ADVIL;MOTRIN) 800 MG TABLET    Take 1 tablet by mouth 2 times daily as needed for Pain    OFLOXACIN (OCUFLOX) 0.3 % SOLUTION    4 times daily    OXYCODONE (ROXICODONE) 5 MG IMMEDIATE RELEASE TABLET    Take 1 tablet by mouth every 6 hours as needed. Max Daily Amount: 20 mg    PREDNISOLONE ACETATE (PRED FORTE) 1 % OPHTHALMIC SUSPENSION    1 drop 4 times daily    SERTRALINE (ZOLOFT) 50 MG TABLET    Take 1 tablet by mouth daily        No results found for any visits on 07/14/22.     No orders to display                     Voice dictation software was used during the making of this note.  This software is not perfect and grammatical and other typographical errors may be present.  This note has not been completely proofread for errors.     Lyda Kalata, APRN - CNP  07/14/22 [phone]

## 2022-08-20 ENCOUNTER — Emergency Department: Admit: 2022-08-20

## 2022-08-20 ENCOUNTER — Inpatient Hospital Stay
Admit: 2022-08-20 | Discharge: 2022-08-20 | Disposition: A | Discharge status: Home / Self Care | Attending: Emergency Medicine

## 2022-08-20 DIAGNOSIS — G44209 Tension-type headache, unspecified, not intractable: Secondary | ICD-10-CM

## 2022-08-20 MED ORDER — KETOROLAC TROMETHAMINE 15 MG/ML IJ SOLN
15 MG/ML | Freq: Once | INTRAMUSCULAR | Status: AC
Start: 2022-08-20 — End: 2022-08-20
  Administered 2022-08-20: 12:00:00 15 mg via INTRAVENOUS

## 2022-08-20 MED ORDER — PROCHLORPERAZINE EDISYLATE 10 MG/2ML IJ SOLN
10 MG/2ML | Freq: Once | INTRAMUSCULAR | Status: AC
Start: 2022-08-20 — End: 2022-08-20
  Administered 2022-08-20: 12:00:00 5 mg via INTRAVENOUS

## 2022-08-20 MED ORDER — LACTATED RINGERS IV BOLUS
INTRAVENOUS | Status: AC
Start: 2022-08-20 — End: 2022-08-20
  Administered 2022-08-20: 12:00:00 1000 mL via INTRAVENOUS

## 2022-08-20 MED ORDER — DIPHENHYDRAMINE HCL 50 MG/ML IJ SOLN
50 MG/ML | INTRAMUSCULAR | Status: AC
Start: 2022-08-20 — End: 2022-08-20
  Administered 2022-08-20: 12:00:00 12.5 mg via INTRAVENOUS

## 2022-08-20 MED FILL — DIPHENHYDRAMINE HCL 50 MG/ML IJ SOLN: 50 MG/ML | INTRAMUSCULAR | Qty: 1

## 2022-08-20 MED FILL — PROCHLORPERAZINE EDISYLATE 10 MG/2ML IJ SOLN: 10 MG/2ML | INTRAMUSCULAR | Qty: 2

## 2022-08-20 MED FILL — KETOROLAC TROMETHAMINE 15 MG/ML IJ SOLN: 15 MG/ML | INTRAMUSCULAR | Qty: 1

## 2022-08-20 NOTE — ED Notes (Signed)
I have reviewed discharge instructions with the patient.  The patient verbalized understanding.    Patient left ED via Discharge Method: ambulatory to Home with (Self).    Opportunity for questions and clarification provided.       Patient given 0 scripts.         To continue your aftercare when you leave the hospital, you may receive an automated call from our care team to check in on how you are doing.  This is a free service and part of our promise to provide the best care and service to meet your aftercare needs." If you have questions, or wish to unsubscribe from this service please call [phone].  Thank you for Choosing our [facility] Emergency Department.        Hazeline Junker, RN  08/20/22 1007

## 2022-08-20 NOTE — Discharge Instructions (Signed)
Combine 1 g of Tylenol with 600 mg of ibuprofen if headache returns for pain control.  Push fluids.  Return for worsening or concerning symptoms.

## 2022-08-20 NOTE — ED Triage Notes (Signed)
Pt arrives from work to triage c/o 9/10 HA that started yesterday. Pt work Water engineer

## 2022-08-20 NOTE — ED Provider Notes (Signed)
Emergency Department Provider Note       PCP: None, None   Age: 36 y.o.   Sex: male     DISPOSITION Decision To Discharge 08/20/2022 08:51:29 AM       ICD-10-CM    1. Tension headache  G44.209           Medical Decision Making     Complexity of Problems Addressed:  1 or more acute illnesses that pose a threat to life or bodily function.     Data Reviewed and Analyzed:  I independently ordered and reviewed each unique test.         I interpreted the CT Scan no acute intracranial process.    Discussion of management or test interpretation.  Headache improving.  Likely tension headache.  Given return precautions.  No neurologic deficits or signs of infection.      Risk of Complications and/or Morbidity of Patient Management:  Shared medical decision making was utilized in creating the patients health plan today.         History       36 year old male presents with bitemporal headache that started while at work yesterday.  He states it is aching and pressure-like and associated with photophobia.  He went home and went to bed and the headache had resolved.  Headache returned after arriving to work today.  He denies history of migraines or recent head injury.  No sinus pain or congestion.  No fever, neck stiffness, focal numbness or weakness.  Has blurry vision with headache, that resolves with headache resolution.  He has not tried anything for pain.           Review of Systems   Constitutional:  Negative for fever.   HENT:  Negative for facial swelling.    Eyes:  Positive for photophobia. Negative for visual disturbance.   Respiratory:  Negative for shortness of breath.    Cardiovascular:  Negative for chest pain.   Gastrointestinal:  Negative for abdominal pain, diarrhea and vomiting.   Musculoskeletal:  Negative for joint swelling.   Skin:  Negative for rash.   Neurological:  Positive for headaches. Negative for speech difficulty, weakness and numbness.   Psychiatric/Behavioral:  Negative for confusion.    All  other systems reviewed and are negative.      Physical Exam     Vitals signs and nursing note reviewed:  Vitals:    08/20/22 0745 08/20/22 0758 08/20/22 0808 08/20/22 0813   BP: (!) 132/95 (!) 127/90 119/81 123/88   Pulse: 66      Resp: 16      Temp: 98.9 F (37.2 C)      SpO2: 99% 99%  99%   Weight: 77.1 kg (170 lb)      Height: 1.905 m (6\' 3" )         Physical Exam  Vitals and nursing note reviewed.   Constitutional:       Appearance: Normal appearance.   HENT:      Head: Normocephalic and atraumatic.      Comments: Bitemporal tenderness     Nose: Nose normal.      Right Sinus: No maxillary sinus tenderness or frontal sinus tenderness.      Left Sinus: No maxillary sinus tenderness or frontal sinus tenderness.      Mouth/Throat:      Mouth: Mucous membranes are moist.   Eyes:      Extraocular Movements: Extraocular movements intact.      Pupils:  Pupils are equal, round, and reactive to light.   Cardiovascular:      Rate and Rhythm: Normal rate and regular rhythm.   Pulmonary:      Effort: Pulmonary effort is normal. No respiratory distress.   Abdominal:      General: Abdomen is flat. There is no distension.   Musculoskeletal:         General: No deformity. Normal range of motion.      Cervical back: Normal range of motion and neck supple.   Skin:     General: Skin is warm and dry.   Neurological:      General: No focal deficit present.      Mental Status: He is alert. Mental status is at baseline.      Cranial Nerves: Cranial nerves 2-12 are intact.      Sensory: Sensation is intact.      Motor: Motor function is intact.      Coordination: Coordination is intact.   Psychiatric:         Mood and Affect: Mood normal.          Procedures     Procedures    Orders Placed This Encounter   Procedures    CT Head W/O Contrast    Saline lock IV        Medications given during this emergency department visit:  Medications   ketorolac (TORADOL) injection 15 mg (15 mg IntraVENous Given 08/20/22 0811)   prochlorperazine  (COMPAZINE) injection 5 mg (5 mg IntraVENous Given 08/20/22 0811)   diphenhydrAMINE (BENADRYL) injection 12.5 mg (12.5 mg IntraVENous Given 08/20/22 0810)   lactated ringers bolus bolus 1,000 mL (1,000 mLs IntraVENous New Bag 08/20/22 0811)       New Prescriptions    No medications on file        No past medical history on file.     No past surgical history on file.     Social History     Socioeconomic History    Marital status: Single   Tobacco Use    Smoking status: Never    Smokeless tobacco: Never   Substance and Sexual Activity    Alcohol use: Yes    Drug use: Never        Previous Medications    CYCLOPENTOLATE (CYCLOGYL) 1 % OPHTHALMIC SOLUTION        IBUPROFEN (ADVIL;MOTRIN) 800 MG TABLET    Take 1 tablet by mouth 2 times daily as needed for Pain    IBUPROFEN (ADVIL;MOTRIN) 800 MG TABLET    Take 1 tablet by mouth 2 times daily as needed for Pain    OFLOXACIN (OCUFLOX) 0.3 % SOLUTION    4 times daily    OXYCODONE (ROXICODONE) 5 MG IMMEDIATE RELEASE TABLET    Take 1 tablet by mouth every 6 hours as needed. Max Daily Amount: 20 mg    PREDNISOLONE ACETATE (PRED FORTE) 1 % OPHTHALMIC SUSPENSION    1 drop 4 times daily    SERTRALINE (ZOLOFT) 50 MG TABLET    Take 1 tablet by mouth daily        Results for orders placed or performed during the hospital encounter of 08/20/22   CT Head W/O Contrast    Narrative    CT of the head without contrast.    CLINICAL INDICATION: Severe temporal headache, photophobia    PROCEDURE: Serial thin section axial images are obtained from the cranial vertex  through the skull base without the administration  of intravenous contrast.   Radiation dose reduction techniques were used for this study. Our CT scanners  use one or all of the following: Automated exposure control, adjusted of the mA  and/or kV according to patient size, iterative reconstruction    COMPARISON: No prior.    FINDINGS: There is no acute intracranial hemorrhage, mass, or mass effect. No  abnormal extra-axial fluid  collections identified. There is no hydrocephalus.  The basilar cisterns are widely patent. The gray-white matter brain parenchymal  interface is well-maintained. No skull fracture or aggressive osseous lesion  noted. The mastoid air cells and included paranasal sinuses are clear.      Impression    Normal head CT            CT Head W/O Contrast   Final Result   Normal head CT                              Voice dictation software was used during the making of this note.  This software is not perfect and grammatical and other typographical errors may be present.  This note has not been completely proofread for errors.     Samella Parr, MD  08/20/22 [phone]

## 2022-12-28 ENCOUNTER — Inpatient Hospital Stay: Admit: 2022-12-28 | Discharge: 2022-12-29 | Disposition: A | Discharge status: Home / Self Care

## 2022-12-28 ENCOUNTER — Emergency Department: Admit: 2022-12-29

## 2022-12-28 DIAGNOSIS — S4991XA Unspecified injury of right shoulder and upper arm, initial encounter: Secondary | ICD-10-CM

## 2022-12-28 NOTE — ED Notes (Signed)
I have reviewed discharge instructions with the patient.  The patient verbalized understanding.    Patient left ED via Discharge Method: ambulatory to Home with self.    Opportunity for questions and clarification provided.       Patient given 1 scripts.         To continue your aftercare when you leave the hospital, you may receive an automated call from our care team to check in on how you are doing.  This is a free service and part of our promise to provide the best care and service to meet your aftercare needs." If you have questions, or wish to unsubscribe from this service please call [phone].  Thank you for Choosing our [facility] Emergency Department.         Nino Glow, LPN  [phone]

## 2022-12-28 NOTE — ED Triage Notes (Signed)
Pt arrived c/o rt shoulder pain after being detained in handcuffs by police. Pt states officer was pulling back on rt shoulder after being handcuffed. Pt denies any injury to head or neck.

## 2022-12-28 NOTE — ED Provider Notes (Signed)
Emergency Department Provider Note                   PCP:                None, None               Age: 37 y.o.      Sex: male     DISPOSITION Decision To Discharge 12/28/2022 08:17:21 PM       ICD-10-CM    1. Injury of right shoulder, initial encounter  S49.91XA Adam Snyder DECISION MAKING  Complexity of Problems Addressed:  Complexity of Problem: 1 acute complicated illness or injury.    Data Reviewed and Analyzed:  Category 1:   I reviewed external records: ED visit note from an outside group.  I reviewed external records: provider visit note from PCP.  I reviewed external records: provider visit note from outside specialist.  I ordered each unique test.  I reviewed the results of each unique test.      Category 2:   I interpreted the X-rays.  No evidence of fracture.    Category 3: Discussion of management or test interpretation.    37 year old male presents with right shoulder pain beginning 2 days ago.  Patient was detained by police who placed him in handcuffs shoving his right arm behind his back.    On presentation, patient is afebrile, vital signs are stable, and he is well-appearing in no acute distress.  He has tenderness to palpation over the anterior [facility] joint with global diffuse range of motion due to pain.  He has 2+ palpable radial pulses with brisk capillary refill.  Full strength and sensation in bilateral upper extremities.  X-ray did not reveal any evidence of fracture, do suspect either a ligamentous or tendon injury.  Will begin patient on Mobic daily for pain and anti-inflammatory qualities.  Advised him to perform gentle range of motion and refrain from any heavy lifting or strenuous exercise.  Will place a referral into orthopedics for further evaluation and workup.  Patient was given strict return precautions.  He verbalized understanding with the plan of care and left the facility in stable condition.       Risk of Complications and/or  Morbidity of Patient Management:  Prescription drug management performed and Shared medical decision making was utilized in creating the patients health plan today.    Adam Snyder is a 37 y.o. male who presents to the Emergency Department with chief complaint of    Chief Complaint   Patient presents with    Shoulder Injury      Patient is a 37 year old male presenting with right shoulder pain beginning 2 days ago.  Patient states he was detained by police who placed him in handcuffs shoving his right arm behind his back.  Patient states he has had pain in the [facility] joint of his right shoulder with pain with overhead movements since.  He denies any swelling, ecchymosis, or obvious deformity.  Denies any numbness, tingling, or weakness in the right upper extremity.  He denies any injuries to his neck, head, or face.  Patient has not tried any medications thus far for his symptoms.  He has contacted the police department and is in the process of filing a report against the police officers.  Patient is a primary historian and the quality of the history appears reliable.    The  history is provided by the patient. No language interpreter was used.        Vitals signs and nursing note reviewed.   Patient Vitals for the past 4 hrs:   Temp Pulse Resp BP SpO2   12/28/22 1854 -- -- 16 -- --   12/28/22 1852 98.4 F (36.9 C) 96 -- 108/80 97 %        Physical Exam  Constitutional:       General: He is not in acute distress.     Appearance: Normal appearance. He is not ill-appearing, toxic-appearing or diaphoretic.   HENT:      Head: Normocephalic and atraumatic. No raccoon eyes, Battle's sign, abrasion, contusion, masses or laceration.      Right Ear: Tympanic membrane, ear canal and external ear normal. There is no impacted cerumen.      Left Ear: Tympanic membrane, ear canal and external ear normal. There is no impacted cerumen.      Nose: Nose normal. No congestion or rhinorrhea.      Mouth/Throat:      Mouth: Mucous  membranes are moist.      Pharynx: Oropharynx is clear.   Eyes:      Conjunctiva/sclera: Conjunctivae normal.      Pupils: Pupils are equal, round, and reactive to light.   Cardiovascular:      Rate and Rhythm: Normal rate and regular rhythm.   Pulmonary:      Effort: Pulmonary effort is normal. No respiratory distress.   Musculoskeletal:      Right shoulder: Tenderness present. No swelling or laceration. Decreased range of motion. Normal strength. Normal pulse.      Cervical back: Normal range of motion. No rigidity or tenderness.      Comments: Tender to palpation to the anterior GH joints, decreased range of motion due to pain.  Positive Hawkins and empty can test.  Negative drop arm.  2+ palpable radial pulse with brisk capillary refill.  5 out of 5 strength in upper extremities bilaterally.  No abnormalities to right elbow, wrist, or hand.  Patient right-hand-dominant.   Lymphadenopathy:      Cervical: No cervical adenopathy.   Skin:     General: Skin is warm.      Capillary Refill: Capillary refill takes less than 2 seconds.      Findings: No erythema or rash.   Neurological:      General: No focal deficit present.      Mental Status: He is alert and oriented to person, place, and time.      Sensory: No sensory deficit.      Motor: No weakness.      Gait: Gait normal.   Psychiatric:         Mood and Affect: Mood normal.         Behavior: Behavior normal.          Procedures     Orders Placed This Encounter   Procedures    XR SHOULDER RIGHT (MIN 2 VIEWS)    [facility]        Medications   ketorolac (TORADOL) injection 30 mg (30 mg IntraMUSCular Given 12/28/22 2034)       New Prescriptions    MELOXICAM (MOBIC) 15 MG TABLET    Take 1 tablet by mouth daily for 14 days        No past medical history on file.     No past surgical history on file.  Results for orders placed or performed during the hospital encounter of 12/28/22   XR SHOULDER RIGHT (MIN 2 VIEWS)    Narrative    Right  Shoulder    INDICATION: Shoulder pain    COMPARISON:  None    TECHNIQUE: Three views of the right shoulder were obtained    FINDINGS: There is no evidence of fracture or dislocation. No bony lesions are  seen in the proximal right humerus or adjacent scapula.      Impression    Negative right shoulder          XR SHOULDER RIGHT (MIN 2 VIEWS)   Final Result   Negative right shoulder               Voice dictation software was used during the making of this note.  This software is not perfect and grammatical and other typographical errors may be present.  This note has not been completely proofread for errors.     Roque Cash, PA-C  12/28/22 2050

## 2022-12-28 NOTE — Discharge Instructions (Addendum)
Please continue to rest and apply heat to your shoulder.  Refrain from any heavy lifting or strenuous exercise.  Please perform gentle range of motion.  Begin taking the Mobic daily for pain and anti-inflammatory qualities.  Please follow-up with the orthopedic, their numbers listed above.  Return to the ED immediately with any new or worsening symptoms.

## 2022-12-29 MED ORDER — MELOXICAM 15 MG PO TABS
15 | ORAL_TABLET | Freq: Every day | ORAL | 0 refills | Status: AC
Start: 2022-12-29 — End: 2023-01-11

## 2022-12-29 MED ORDER — KETOROLAC TROMETHAMINE 30 MG/ML IJ SOLN
30 | Freq: Once | INTRAMUSCULAR | Status: AC
Start: 2022-12-29 — End: 2022-12-28
  Administered 2022-12-29: 02:00:00 30 mg via INTRAMUSCULAR

## 2022-12-29 MED FILL — KETOROLAC TROMETHAMINE 30 MG/ML IJ SOLN: 30 MG/ML | INTRAMUSCULAR | Qty: 1

## 2023-01-02 ENCOUNTER — Ambulatory Visit: Admit: 2023-01-02

## 2023-01-02 ENCOUNTER — Ambulatory Visit: Admit: 2023-01-02 | Discharge: 2023-01-02 | Attending: Physician Assistant

## 2023-01-02 DIAGNOSIS — M542 Cervicalgia: Secondary | ICD-10-CM

## 2023-01-02 DIAGNOSIS — M47812 Spondylosis without myelopathy or radiculopathy, cervical region: Secondary | ICD-10-CM

## 2023-01-02 MED ORDER — PREDNISONE 10 MG (48) PO TBPK
10 | ORAL | 0 refills | Status: AC
Start: 2023-01-02 — End: 2023-01-14

## 2023-01-02 NOTE — Progress Notes (Signed)
Name: Adam Snyder  Date of Birth: [birth year]  Gender: male  MRN: [id number]    CC:   Chief Complaint   Patient presents with    New Patient     Neck pain          HPI:   Saifan Zuo is a 37 y.o. male with a PMHx of no known past medical history.         They present here for evaluation of neck pain rating to the right shoulder and arm with numbness and tingling.  This started last Monday.  He was getting served a warrant, and was being handcuffed by Engineer, structural when his right arm was forced in an uncomfortable position.  Since that time he has had pain rating down his right arm into parts of his hand.  He has numbness and tingling in the right arm and hand.  He has never had this before.  He was seen at the emergency room a few days later and was prescribed meloxicam.  Patient works at a hospital in housekeeping.  He is currently been placed out of work for 2 weeks.            Past Medical History Includes: No past medical history on file., No past surgical history on file.  Family History: No family history on file.   Social History:   Social History     Tobacco Use    Smoking status: Never    Smokeless tobacco: Never   Substance Use Topics    Alcohol use: Yes       ALLERGIES: No Known Allergies       Patient Medications    Current Outpatient Medications   Medication Sig Dispense Refill    meloxicam (MOBIC) 15 MG tablet Take 1 tablet by mouth daily for 14 days (Patient not taking: Reported on 01/02/2023) 14 tablet 0    cyclopentolate (CYCLOGYL) 1 % ophthalmic solution  (Patient not taking: Reported on 01/02/2023)      ofloxacin (OCUFLOX) 0.3 % solution 4 times daily (Patient not taking: Reported on 01/02/2023)      oxyCODONE (ROXICODONE) 5 MG immediate release tablet Take 1 tablet by mouth every 6 hours as needed. Max Daily Amount: 20 mg (Patient not taking: Reported on 01/02/2023)      prednisoLONE acetate (PRED FORTE) 1 % ophthalmic suspension 1 drop 4 times daily (Patient not taking: Reported on 01/02/2023)       ibuprofen (ADVIL;MOTRIN) 800 MG tablet Take 1 tablet by mouth 2 times daily as needed for Pain (Patient not taking: Reported on 01/02/2023) 180 tablet 0    sertraline (ZOLOFT) 50 MG tablet Take 1 tablet by mouth daily (Patient not taking: Reported on 01/02/2023) 30 tablet 0    ibuprofen (ADVIL;MOTRIN) 800 MG tablet Take 1 tablet by mouth 2 times daily as needed for Pain (Patient not taking: Reported on 01/02/2023) 60 tablet 0     No current facility-administered medications for this visit.             Review of Systems:  As per HPI.  Pertinent positives and negatives are addressed with the patient, particularly those related to musculoskeletal concerns.   Other non-emergent concerns were referred back to the primary care physician.      PHYSICAL EXAMINATION:   The patient appears their stated age and they are in no distress.  Ht 1.905 m ('6\' 3"'$ )   Wt 81.6 kg (180 lb)   BMI 22.50 kg/m  Neuro:   Reflexes  Bicep:   Normal to diminished  Tricep: Normal to diminished  BR:       Normal to diminished    Hoffman's: Negative        Sensory    Sensation to light touch intact C5-T1      Motor    Deltoids: Pain limited but at least 4/5 on the right and grossly normal on the left    Biceps: Pain limited but at least 4/5 on the right and grossly normal on the left    Triceps: Pain limited but at least 4/5 on the right and gross normal in the left  Wrist extension/flexion: grossly normal    I/O:  grossly normal    Grip: grossly normal     Point tenderness: Minimal    Gait: No obvious ataxia or appreciable limp              IMAGING:     MRI Result (most recent):  No results found for this or any previous visit from the past 3650 days.        AP and lateral views of the cervical spine reveal mild cervical spondylosis.  Fair alignment.  Loss of lordosis.  Mild loss of disc height.          ASSESSMENT AND PLAN:   The patient has radiating pain down the right shoulder arm and hand with numbness and tingling.  High suspicious for  cervical radiculopathy.  I recommend a course of Sterapred pack and referral to physical therapy.  I recommend he hold off on the meloxicam while taking the steroids and then he can resume it afterwards.  I will plan to follow the patient in 6 or 8 weeks if he fails to gain meaningful improvement with conservative management.      4--this is an undiagnosed new problem with uncertain prognosis

## 2023-01-06 ENCOUNTER — Encounter: Attending: Orthopaedic Surgery

## 2023-01-13 ENCOUNTER — Inpatient Hospital Stay

## 2023-01-13 NOTE — Discharge Instructions (Incomplete)
Adam Snyder  DOB: July 18, 1986  Primary:  (Self-Pay)  Secondary:  [facility]  [place] [address]  Phone: [phone]  Fax: [phone]           Plan of Care/Certification Expiration Date:   ***  Frequency/Duration: ***     Time In/Out: ***         PT Visit Info:         Visit Count:  Visit count could not be calculated. Make sure you are using a visit which is associated with an episode.                OUTPATIENT PHYSICAL THERAPY:             {OP Note Type:66037::"Initial Assessment"} 01/13/2023               Episode (No data found)         Treatment Diagnosis:  ***   No data found  Medical/Referring Diagnosis:    {Medical Diagnosis:65500}   Referring Physician:  Rosalyn Charters, PA  MD Orders:  PT Eval and Treat ***  Return MD Appt:  ***  Date of Onset:    ***  Allergies:  Patient has no known allergies.  Restrictions/Precautions:  ***  {Precautions/Restrictions:64212}      Medications Last Reviewed:  01/13/2023     SUBJECTIVE   History of Injury/Illness (Reason for Referral):  Pt is a *** yo *** referred to physical therapy due to ***.    Pain/Symptom Location: ***          Aggravating Factors/ Functional limitations: pain/difficulty with ***          Alleviating Factors/Positions/Motions:  ***    Falls: none***    Diagnostic Imaging: ***    Patient Stated Goal(s):  Pt is hoping to be able to function without limitation due to pain or recurrence of low back pain.  Initial Pain Level:       /10 ***  Post Session Pain Level:      /10  Past Medical History/Comorbidities:   Adam Snyder  has no past medical history on file.  Adam Snyder  has no past surgical history on file.  Social History/Living Environment: Pt lives with *** in a *** level home with *** to enter.  {PT/OT Social History/Living Environment:64255}    Prior Level of Function/Work/Activity: ***  {PT/OT Level of Function/Work/Activity:64267}     Learning:      Fall Risk Scale: ***      {Additional Subjective Info (Optional):45545}      OBJECTIVE   Observation        Posture: ***        ________________________________________________________________________________________________  Range of Motion     Cervical    AROM (degrees)   Flexion ***   Extension ***   Right side bending  ***   Left side bending ***   Right rotation ***   Left rotation ***       Shoulder:   Left Right    AROM PROM AROM PROM   Flexion       Abduction       ER       IR           ________________________________________________________________________________________________  Strength         Upper Extremity    Joint:      RIGHT LEFT   Shoulder Flexion ***/5 ***/5   Shoulder  Extension ***/5 ***/5   Shoulder Abduction ***/5 ***/5   Shoulder Internal Rotation ***/5 ***/5   Shoulder External Rotation ***/5 ***/5   Grip dynamometry Level 2 ***lbs ***lbs   Pinch grip dynamometry     ________________________________________________________________________________________________  [facility]        Sensation: ***        UE NEUROLOGICAL SCREEN: Pt denies diplopia, dysarthria, dysphagia, drop attacks, dizziness, nausea, numbness and no visible resting nystagmus.     -Dermatomes  Date:     Right Left   C4: upper traps     C5: Over deltoids     C6: index     C7: middle     C8: medial hand       -Myotomes  Date:     Right Left   C1-3: cervical rotation     C4: shoulder shrug     C5: shoulder abd     C6: elbow flxn/wrist ext     C7: elbow ext/wrist flxn     C8: thumb ext     T1: finger abd       Heel walking = ***  Toe walking = ***      -REFLEXES Date:     Right Left   C5  (Biceps)     C6  (Biceps or Brachioradialis)     C7  (Triceps)     Patellar reflex       Abnormal Reflexes:   Hoffman's Sign: ***  Tromner: ***  Babinski: ***  Clonus: ***    Cranial Nerve Screen: normal  ________________________________________________________________________________________________    Physical Performance Tests:  Scapular Endurance Test:  ***  Neck Flexor Endurance test: ***  CKCUES Test: ***    Special Tests  Modified Sharp-Purser Test: (for transverse ligament) ***  Alar Ligament Stability Test: ***  End range rotation test for CAD: ***  Inverted supinator sign: (tests brachioradialis reflex) ***    Shoulder Screening Tests:   Neer Impingement Test: ***  Hawkins-Kennedy Test: ***  Shoulder Overpressure: ***  Flexion-Rotation Test: (for Cervicogenic headache) ***  Spurling's: ***  Quadrant Test: ***  ________________________________________________________________________________________________    Palpation: ***    Joint Mobilization: ***  ASSESSMENT   Initial Assessment:    Patient presents with signs and symptoms that are consistent with: ***  The functional deficits are as follows: ***  Therapy Problem List: (Impacting functional limitations):    Decreased functional mobility ;Decreased ADL status;Decreased ROM;Decreased body mechanics;Decreased tolerance to work activity;Decreased strength;Decreased endurance;Decreased balance;Decreased coordination;Increased pain;Decreased posture;  {PT/OT Problem List:58469::"Decreased Strength","Decreased ROM","Decreased Functional Mobility","Decreased Independence with Home Exercise Program","Decreased Posture","Decreased Body Mechanics","Decreased Activity Tolerance/Endurance*"}   Therapy Prognosis: ***  Therapy Prognosis: Good  {Therapy Prognosis:64767}     Initial Assessment Complexity: ***  Decision Making: Medium Complexity  {Assessment Complexity HJ:207364       PLAN   Effective Dates: 01/13/2023 TO     Frequency/Duration:      Interventions Planned (Treatment may consist of any combination of the following):    Strengthening;ROM;Balance training;Functional mobility training;Transfer training;Endurance training;Neuromuscular re-education;Manual;Pain management;Home exercise program;Safety education & training;Patient/Caregiver education & training;Therapeutic activities;  {PT INTERVENTION  LIST:58468::"Home Exercise Program (HEP)","Therapeutic Activites","Therapeutic Exercise/Strengthening"}     Short-Term Functional Goals: Time Frame: 4 weeks  Pt will be compliant with initial HEP.   Pt will decrease worst pain to 4/10 with functional activities.  Pt will decreased NDI to ***% or less to demonstrate improvement in overall self reported function.    Discharge Goals: Time Frame: 12 weeks  Pt will decrease worst pain to 1/10 with all functional activities.  Pt will decreased NDI to ***% or less demonstrate improvement in overall self reported function.  Pt will demonstrate all active cervical ROM without increased symptoms in order to return to driving.  Pt will have no c/o radicular symptoms. ***  Pt will be able to work without limitation. ***  Pt will be able to shower, dress, and perform household chores without limitation. ***         Tool Used: Neck Disability Index (NDI)  Score:  Initial: ***% Most Recent: X% (Date: -- )   Interpretation of Score: The Neck Disability Index is a revised form of the Oswestry Low Back Pain Index and is designed to measure the activities of daily living in person's with neck pain.  Each section is scored on a 0-5 scale, 5 representing the greatest disability.  The scores of each section are added together for a total score of 50.      Medical Necessity:   Skilled intervention is required due to decreased strength, ROM, *** balance, and functional mobility.  Reason for Services/Other Comments:  Patient requires skilled intervention due to decreased strength, *** balance, and ROM with increased pain affecting pt functional mobility.    Medical Necessity: ***  {PT Medical Necessity:39885}  Reason For Services/Other Comments:***  {PT Continuation:39886}      Regarding Ryo Blecha's therapy, I certify that the treatment plan above will be carried out by a therapist or under their direction.  Thank you for this referral,  Laurice Record, PT, DPT, COMT      Referring Physician Signature: Adam Charters, PA {MD Signature Line:45300}        Charge Capture  Appt Desk

## 2023-01-13 NOTE — Progress Notes (Incomplete)
Etheleen Sia  DOB: 09/30/1986  Primary:  (Self-Pay)  Secondary:  [facility] [place]  [place] [address]  Phone: [phone]  Fax: [phone]    No data recorded      Plan of Care/Certification Expiration Date:     Frequency/Duration:      Time In/Out: ***         PT Visit Info:         Visit Count:  Visit count could not be calculated. Make sure you are using a visit which is associated with an episode.    OUTPATIENT PHYSICAL THERAPY:   Treatment Note 01/13/2023       Episode  (No data found)               Treatment Diagnosis:  ***  No data found  Medical/Referring Diagnosis:    {Medical Diagnosis:65500}   Referring Physician:  Rosalyn Charters, PA  MD Orders:  PT Eval and Treat ***  Return MD Appt:  ***   Date of Onset:  No data recorded ***  Allergies:   Patient has no known allergies.  Restrictions/Precautions:   {Precautions/Restrictions:64212}      Interventions Planned (Treatment may consist of any combination of the following):     See Assessment Note    REASON FOR TREATMENT:  ***    Functional limitations reported at initial Eval: ***      Subjective Comments: see eval.    Progress Note Counter:       Initial Pain Level::      /10 ***  Post Session Pain Level:        /10  Medications Last Reviewed:  01/13/2023  Updated Objective Findings:  {New Findings:45960}  Treatment   {TREATMENTSOPPT:45563}  {Grids/Tables:42570}    TREATMENT GRID: Therapeutic exercises, activities, and neuromuscular re-education.     Therapeutic Exercise: Exercises per grid below to improve mobility, strength, balance***, and coordination. Required minimal verbal, manual, and tactile cues to promote proper body alignment, posture, and body mechanics. Progressed resistance, repetitions, and complexity of movement as indicated.    Therapeutic Activities: Therapeutic activities per grid below including dynamic activities and education to improve functional performance and improve  mobility to improve mobility, strength, balance***, and coordination. Required minimal visual, verbal, manual, and tactile cues to promote static and dynamic balance*** in standing, coordination and motor control of *** lower extremity.    Neuromuscular Re-education: Exercise/activities per grid below to improve balance***, coordination, posture, and proprioception. Includes pain neuroscience education and verbal and manual cues for muscle activation and postural control and correction. Required minimal visual, verbal, and manual cues to promote dynamic balance *** in standing and coordination and motor control of *** lower extremity.     Shoulder: ***  TREATMENT GRID: Therapeutic exercises, activities, and neuromuscular re-education.     Therapeutic Exercise: Exercises per grid below to improve mobility, strength, and coordination. Required minimal verbal, manual, and tactile cues to promote proper body alignment, posture, and body mechanics. Progressed resistance, repetitions, and complexity of movement as indicated.    Therapeutic Activities: Therapeutic activities per grid below including dynamic activities and education to improve functional performance and improve mobility to improve mobility, strength, and coordination. Required minimal visual, verbal, manual, and tactile cues to promote coordination and motor control of *** upper extremity.     Neuromuscular Re-education: Exercise/activities per grid below to improve coordination, posture, and proprioception. Includes pain neuroscience education and verbal and manual cues for  muscle activation and postural control and correction. Required minimal visual, verbal, and manual cues to promote coordination and motor control of *** upper extremity.    Tx area: *** ***      Activity/Exercise Parameters Parameters   HEP yes    Pt education yes                                                HEP: ***   Band colors given to pt: []  Yellow          []  Red          []   Green          []  Blue          []  Grey      MANUAL THERAPY: ***     MODALITIES: ***     Education: Pt education for initial HEP safety, exercise frequency and duration, symptom control, mechanics, fall risk, and anatomy and physiology of present condition/healing time.     Treatment/Session Summary:    Treatment Assessment:   Pt demonstrated good tolerance to first visit with introduction to  therapeutic exercises and activities to simulate functional limitations.    Communication/Consultation:  {Communication:45566}  Equipment provided today:  {Equipment Provided SS:[id number]  Recommendations/Intent for next treatment session: Next visit will focus on advancements to more challenging activities. Progress repetitions, weight, and complexity of functional movement per pt tolerance and as indicated.    >Total Treatment Billable Duration:  *** minutes           Manual Therapy: (0 minutes)     Ther-Ex: (0 minutes)     Ther-Act: (0 minutes)     Neuro Re-ed (0 minutes)     Modalities: (0 minutes)    Laurice Record, PT, DPT, COMT         Charge Capture  Med[employer] Portal  Appt Desk     Future Appointments   Date Time Provider [place]   01/13/2023  1:00 PM Laurice Record, PT [facility]

## 2023-01-16 ENCOUNTER — Encounter

## 2023-01-24 ENCOUNTER — Inpatient Hospital Stay: Admit: 2023-01-24

## 2023-01-24 DIAGNOSIS — M542 Cervicalgia: Secondary | ICD-10-CM

## 2023-01-24 NOTE — Progress Notes (Incomplete)
Adam Snyder  DOB: [birth year]  Primary:  (Self-Pay)  Secondary:  Adam Snyder  [address]  [place] [address]  Phone: [phone]  Fax: [phone] Plan Frequency: twice a week for 10 weeks    Plan of Care/Certification Expiration Date: 04/10/23      Plan of Care/Certification Expiration Date:  Plan of Care/Certification Expiration Date: 04/10/23    Frequency/Duration:   Plan Frequency: twice a week for 10 weeks      Time In/Out:   Time In: 1347  Time Out: 1450    PT Visit Info:    Plan Frequency: twice a week for 10 weeks  Progress Note Counter: 1        Visit Count:  1     OUTPATIENT PHYSICAL THERAPY:OP NOTE TYPE: Treatment Note 01/24/2023       Charge Capture   Episode              Treatment Diagnosis:    Cervical pain (neck)  Acute pain of right shoulder  Pain in right arm  Medical/Referring Diagnosis:  Neck pain     Referring Physician:  Eula Flax, PA  MD Orders:  PT Eval and Treat ***  Return MD Appt:  ***  Date of Onset:  Onset Date: 12/26/22     Allergies:   Patient has no known allergies.  Restrictions/Precautions:    {Precautions/Restrictions:64212}  Interventions Planned See assessment note.        Subjective Assessment: See history from evaluation today.     Progress Note Counter: 1     Initial Pain Level:}     (no specific pain level reported)/10  Post Session Pain Level:       8/10  Medications Last Reviewed:  01/24/2023  Updated Objective Findings:  {New Findings:45960}  Treatment   In addition to assessment today, the following treatments were provided:  Therapeutic Activity:  ( 9 minutes):  Pt education for initial HEP, symptom control, body mechanics, postural awareness and anatomy and physiology of present condition/healing time.  ***(glenohumeral distraction  Modalities: (15 minutes)    With pt in supported sitting position, vasopneumatic compression with cold therapy via Game Ready system applied to R shoulder with medium pressure and at 38  degrees to reduce pain, swelling, and inflammation.  Edema Measurements: Involved - Pre: *** cm , Post: *** cm    Uninvolved -  *** cm   Effects of Edema on Function: ***.      Treatment/Session Summary:    Treatment Assessment: Initial evaluation today - see assessment in chart. Pt stating significant improvement in pain level after gameready.    Communication/Consultation:  Therapy Evaluation sent to referring provider  Equipment provided today:  None  Recommendations/Intent for next treatment session: Next visit will focus on ***.    Total Treatment Billable Duration:  24 minutes  Time In: 1347  Time Out: 1450    Jobe Gibbon, PT       Charge Capture  MedBridge Portal  Appt Desk     Future Appointments   Date Time Provider Department Center   01/26/2023  8:00 AM Jobe Gibbon, PT [facility]   01/30/2023  8:00 AM Wendall Mola B, PT [facility]   02/02/2023  8:00 AM Wendall Mola B, PT [facility]   02/06/2023  2:30 PM Wendall Mola B, PT [facility]   02/09/2023  1:00 PM Jobe Gibbon, PT [facility]
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
## 2023-01-26 ENCOUNTER — Encounter

## 2023-01-26 ENCOUNTER — Inpatient Hospital Stay: Admit: 2023-01-26
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
## 2023-01-30 ENCOUNTER — Encounter

## 2023-01-30 ENCOUNTER — Inpatient Hospital Stay

## 2023-01-30 NOTE — Progress Notes (Signed)
Etheleen Sia  DOB: [birth year]  Primary:   Secondary:  [facility] [place]  [place] [address]  Phone: [phone]  Fax: [phone] Plan Frequency: twice a week for 10 weeks    Plan of Care/Certification Expiration Date: 04/10/23      PT Visit Info:    Progress Note Counter: 2  No Show: 1      OUTPATIENT PHYSICAL THERAPY 01/30/2023     Appt Desk   Episode   MyChart      Mr. Newsum was a No Show for today's appointment. Discussed missed appt with pt and reminded him that we may discharge if he has another missed session.    No Show Number: [place], PT    Future Appointments   Date Time Provider [place]   02/02/2023  8:00 AM Ricard Dillon, PT [facility]   02/06/2023  2:30 PM Ricard Dillon, PT [facility]   02/09/2023  1:00 PM Ricard Dillon, PT [facility] Meta Hatchet

## 2023-02-02 ENCOUNTER — Inpatient Hospital Stay: Admit: 2023-02-02
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
## 2023-02-06 ENCOUNTER — Inpatient Hospital Stay: Admit: 2023-02-06

## 2023-02-06 NOTE — Progress Notes (Signed)
Adam Snyder  DOB: [birth year]  Primary:  (Self-Pay)  Secondary:  Adam Snyder  [address]  [place] [address]  Phone: [phone]  Fax: [phone] Plan Frequency: twice a week for 10 weeks    Plan of Care/Certification Expiration Date: 04/10/23      Plan of Care/Certification Expiration Date:  Plan of Care/Certification Expiration Date: 04/10/23    Frequency/Duration:   Plan Frequency: twice a week for 10 weeks      Time In/Out:   Time In: 1433  Time Out: 1532    PT Visit Info:    Plan Frequency: twice a week for 10 weeks  Progress Note Counter: 4  No Show: 1        Visit Count:  4     OUTPATIENT PHYSICAL THERAPY:OP NOTE TYPE: Treatment Note 01/24/2023       Charge Capture   Episode              Treatment Diagnosis:    Cervical pain (neck)  Acute pain of right shoulder  Pain in right arm  Medical/Referring Diagnosis:  Neck pain     Referring Physician:  Eula Flax, PA  MD Orders:  PT Eval and Treat   Return MD Appt:  unspecified  Date of Onset:  Onset Date: 12/26/22     Allergies:   Patient has no known allergies.  Restrictions/Precautions:    None  Interventions Planned See assessment note.         Subjective Assessment: Pt walking into session at 2:33 today - states he had to run in due to lack of parking    Progress Note Counter: 4     Initial Pain Level:}     (no specific pain level reported)/10  Post Session Pain Level:       6/10  Medications Last Reviewed:  02/06/2023  Updated Objective Findings:  None Today  Treatment   Therapeutic Exercise: ( 16 minutes ):  Exercises per grid below to improve mobility and strength.                                    Most recent tx:   Date:  02/06/2023 Date:  02/02/2023 Date:  01-26-23   Activity/Exercise   Parameters   UBE L4 resistance for 4 minutes forward, 4 minutes backward L4 resistance for 4 minutes forward, 4 minutes backward    Pulleys  20 reps/1 set into flexion then scaption with cues to move through maximum  pain-free range of motion as available     Shoulder flexion AAROM With 1 lb wand; 20 reps/1 set with cues for maximizing motion as able     Scapular squeezes   20 reps/1 set in sitting with cues to avoid shoulder elevation X 10 in sitting   X 10 in supine    Chin tucks    10 x 5 sec hold in supine    1st rib mob    With sheet  Attempted- patient said too painful    Sidelying shoulder ER  1 lb dumbbell; 20 reps/1 set R UE with cues to avoid painful range    Supine shoulder CW/CCW circles  No weight; 20 reps/1 set each direction R UE with cues for control    Pendulum    Circles - CW, CCW  Side to side  Back and forth   X 10 R  each direction with cues to relax    Shoulder elevation/hold/depression  15 reps/1 set with slow lifting, holding, then lowering all the way down    Upper trap stretch in sitting  30 second hold x 5 reps to L with R hand anchored at mat          *given in HEP  MedBridge Portal   Pt given following band colors: []  Yellow          []  Red          []  Green          []  Blue          []  Grey     Manual Therapy: (23 minutes)  With pt in supported supine position:   - passive ROM into R shoulder flexion, abduction, then ER at 15, 45, and 90 degrees abduction to maximize range and reduce muscular tightness and pain   - gentle STM to R biceps and pectoral musculature with cross friction massage at biceps tendon to reduce muscular tightness and pain    Modalities: (15 minutes)    With pt in supported sitting position, vasopneumatic compression with cold therapy via Game Ready system applied to R shoulder with medium pressure and at 38 degrees to reduce pain, swelling, and inflammation.  Edema Measurements:  Visual observation of edema surrounding R shoulder and upper arm   Effects of Edema on Function:  Shoulder mobility and posture.      Treatment/Session Summary:    Treatment Assessment:  Pt demonstrating improved R shoulder mobility overall this session but continues to verbalize pain at end-range. He  continues to report significant tenderness in his R biceps and especially biceps tendon, so will continue to work on this.  Communication/Consultation:  None today  Equipment provided today:  None  Recommendations/Intent for next treatment session: Next visit will focus on manual therapy/modalities as needed to reduce pain; work on scapular and shoulder strengthening; cervical mobility.    Total Treatment Billable Duration: 54 minutes   Time In: 1433  Time Out: 1532    Jobe Gibbon, PT       Charge Capture  MedBridge Portal  Appt Desk     Future Appointments   Date Time Provider Department Center   02/09/2023  1:00 PM Jobe Gibbon, PT [facility]   02/14/2023 11:00 AM Phoebe Sharps, PTA [facility]   02/17/2023  2:30 PM Jobe Gibbon, PT [facility]

## 2023-02-09 ENCOUNTER — Inpatient Hospital Stay: Admit: 2023-02-09

## 2023-02-09 NOTE — Progress Notes (Signed)
Adam Snyder  DOB: 1986/04/23  Primary:  (Self-Pay)  Secondary:  Adam Snyder  [address]  [place] [address]  Phone: [phone]  Fax: [phone] Plan Frequency: twice a week for 10 weeks    Plan of Care/Certification Expiration Date: 04/10/23      Plan of Care/Certification Expiration Date:  Plan of Care/Certification Expiration Date: 04/10/23    Frequency/Duration:   Plan Frequency: twice a week for 10 weeks      Time In/Out:   Time In: 1429  Time Out: 1510    PT Visit Info:    Plan Frequency: twice a week for 10 weeks  Progress Note Counter: 4  No Show: 1        Visit Count:  5     OUTPATIENT PHYSICAL THERAPY:OP NOTE TYPE: Treatment Note 01/24/2023       Charge Capture   Episode              Treatment Diagnosis:    Cervical pain (neck)  Acute pain of right shoulder  Pain in right arm  Medical/Referring Diagnosis:  Neck pain     Referring Physician:  Eula Flax, PA  MD Orders:  PT Eval and Treat   Return MD Appt:  unspecified  Date of Onset:  Onset Date: 12/26/22     Allergies:   Patient has no known allergies.  Restrictions/Precautions:    None  Interventions Planned See assessment note.         Subjective Assessment: Pt walking into session at 2:33 today - states he had to run in due to lack of parking    Progress Note Counter:       Initial Pain Level:}     /10  Post Session Pain Level:        /10  Medications Last Reviewed:  02/09/2023  Updated Objective Findings:  None Today  Treatment   Therapeutic Exercise: ( 16 minutes ):  Exercises per grid below to improve mobility and strength.                                    Most recent tx:   Date;02/09/23   Date:  02/06/2023 Date:  02/02/2023 Date:  01-26-23   Activity/Exercise    Parameters   UBE  L4 resistance for 4 minutes forward, 4 minutes backward L4 resistance for 4 minutes forward, 4 minutes backward    Pulleys   20 reps/1 set into flexion then scaption with cues to move through maximum pain-free range of  motion as available     Shoulder flexion AAROM With 1 lb wand; 20 reps/1 set with cues for maximizing motion as able With 1 lb wand; 20 reps/1 set with cues for maximizing motion as able     Scapular squeezes  20 reps/1 set in sitting with cues to avoid shoulder elevation  20 reps/1 set in sitting with cues to avoid shoulder elevation X 10 in sitting   X 10 in supine    Chin tucks     10 x 5 sec hold in supine    1st rib mob     With sheet  Attempted- patient said too painful    Sidelying shoulder ER 1 lb dumbbell; 20 reps/1 set R UE with cues to avoid painful range  1 lb dumbbell; 20 reps/1 set R UE with cues to avoid painful range  Supine shoulder CW/CCW circles No weight; 20 reps/1 set each direction R UE with cues for control  No weight; 20 reps/1 set each direction R UE with cues for control    Pendulum     Circles - CW, CCW  Side to side  Back and forth   X 10 R each direction with cues to relax    Shoulder elevation/hold/depression   15 reps/1 set with slow lifting, holding, then lowering all the way down    Upper trap stretch in sitting   30 second hold x 5 reps to L with R hand anchored at mat    Supine perturbations, cues for downward scapular retractions, cues for upper trap inhibition, lower trap activation  X 30 serratus punches #2    10 x 10 sec holds A-P #2    10 x 10 secs holds M-L #2      *given in HEP  MedBridge Portal   Pt given following band colors:  Yellow           Red           Green           Blue           Grey       NEUROMUSCULAR RE-EDUCATION: (23 minutes):    Exercise/activities per grid below to improve balance, coordination, kinesthetic sense, posture and proprioception.  Required no visual, verbal, manual and tactile cues to promote motor control of bilateral, upper extremity(s)    Manual Therapy: ( minutes)  With pt in supported supine position:   - passive ROM into R shoulder flexion, abduction, then ER at 15, 45, and 90 degrees abduction to maximize range and reduce  muscular tightness and pain   - gentle STM to R biceps and pectoral musculature with cross friction massage at biceps tendon to reduce muscular tightness and pain    Modalities: (0 minutes)    With pt in supported sitting position, vasopneumatic compression with cold therapy via Game Ready system applied to R shoulder with medium pressure and at 38 degrees to reduce pain, swelling, and inflammation.  Edema Measurements:  Visual observation of edema surrounding R shoulder and upper arm   Effects of Edema on Function:  Shoulder mobility and posture.      Treatment/Session Summary:    Treatment Assessment:  pt tolerated tx well. Pt does present with multi directional instability of the RUE shldr complex. Pt does present with diffuse bilateral scapular retraction weakness. Will progress prn    Communication/Consultation:  None today  Equipment provided today:  None  Recommendations/Intent for next treatment session: Next visit will focus on manual therapy/modalities as needed to reduce pain; work on scapular and shoulder strengthening; cervical mobility.    Total Treatment Billable Duration: 39 minutes   Time In: 1429  Time Out: 1510    Marcial Pacas, PT       Charge Capture  MedBridge Portal  Appt Desk     Future Appointments   Date Time Provider Department Center   02/14/2023 11:00 AM Phoebe Sharps, PTA [facility]   02/17/2023  2:30 PM Jobe Gibbon, PT [facility]

## 2023-02-09 NOTE — Progress Notes (Signed)
[name]  DOB: 02/24/1986  Primary:   Secondary:  [name]  [address]  [place] [address]  Phone: [phone]  Fax: [phone] Plan Frequency: twice a week for 10 weeks    Plan of Care/Certification Expiration Date: 04/10/23      PT Visit Info:    Progress Note Counter: 4  No Show: 1      OUTPATIENT PHYSICAL THERAPY 02/09/2023     Appt Desk   Episode   MyChart      Mr. Kusiak was a No Show for today's appointment at 1:00.  Called and discussed with pt and he stated he misunderstood and thought his appointment was supposed to be at 1:30. Offered appointment at 2:30 to pt and told him to be 5-10 minutes early to make sure he is on time - pt verbalizing understanding.    No Show Number:      Jobe Gibbon, PT    Future Appointments   Date Time Provider Department Center   02/09/2023  2:30 PM Marcial Pacas, [facility]   02/14/2023 11:00 AM Phoebe Sharps, PTA [facility]   02/17/2023  2:30 PM Jobe Gibbon, PT [facility]

## 2023-02-13 ENCOUNTER — Inpatient Hospital Stay: Admit: 2023-02-13 | Discharge: 2023-02-13 | Disposition: A | Discharge status: Home / Self Care

## 2023-02-13 DIAGNOSIS — M25521 Pain in right elbow: Secondary | ICD-10-CM

## 2023-02-13 MED ORDER — KETOROLAC TROMETHAMINE 30 MG/ML IJ SOLN
30 | Freq: Once | INTRAMUSCULAR | Status: AC
Start: 2023-02-13 — End: 2023-02-13
  Administered 2023-02-13: 10:00:00 30 mg via INTRAMUSCULAR

## 2023-02-13 MED ORDER — PREDNISONE 50 MG PO TABS
50 | ORAL_TABLET | Freq: Every day | ORAL | 0 refills | Status: AC
Start: 2023-02-13 — End: 2023-02-18

## 2023-02-13 MED FILL — KETOROLAC TROMETHAMINE 30 MG/ML IJ SOLN: 30 MG/ML | INTRAMUSCULAR | Qty: 1

## 2023-02-13 NOTE — ED Provider Notes (Signed)
Emergency Department Provider Note       PCP: None, None   Age: 37 y.o.   Sex: male     DISPOSITION Decision To Discharge 02/13/2023 05:38:10 AM       ICD-10-CM    1. Right elbow pain  M25.521           Medical Decision Making     Patient is a 37 year old male complaining of left medial elbow pain.  Patient is currently undergoing physical therapy for cervical radiculopathy.  He states they have been performing many pronation supination movements and over the past 5 days he has noticed worsening pain to the medial aspect of his right elbow.    On presentation, patient is afebrile, vital signs are stable, and he is well-appearing in no acute distress.  He has tenderness to palpation over the medial epicondyle region mainly over the tendon and distal muscle region.  There is no swelling, erythema, warmth, streaking, or signs of infection.  He has full range of motion in the elbow.  No swelling or tenderness of the bursa.  He has 2+ palpable radial pulse, brisk capillary refill, and full strength and sensation in the right upper extremity.    Do not believe x-ray images are warranted at today's visit as patient did not have any injury.  Do not believe there is any fracture.  Likely medial epicondylitis.  Will give patient a shot of Toradol in facility and begin on a prednisone burst to begin taking at home.  He was instructed to follow-up with the orthopedics.  He was given strict return precautions.  He verbalized understanding with plan of care and left facility in stable condition.     1 acute complicated illness or injury.  Prescription drug management performed.  Shared medical decision making was utilized in creating the patients health plan today.    I independently ordered and reviewed each unique test.  I reviewed external records: ED visit note from an outside group.  I reviewed external records: provider visit note from outside specialist.                   History     Patient is a 37 year old male  complaining of left medial elbow pain.  Patient is currently undergoing physical therapy for cervical radiculopathy.  He states they have been performing many pronation supination movements and over the past 5 days he has noticed worsening pain to the medial aspect of his right elbow.  He denies any new injury or trauma.  He denies any erythema, warmth, streaking, drainage, open wounds, or signs of infection.  Denies any fevers.  Denies any decreased strength in the right upper extremity.  Denies any numbness, tingling, pallor or coolness to the touch.  Patient is followed with [facility] orthopedic spine for cervical radiculopathy, he is not currently taking any medications for his pain.  He has not tried anything thus far for symptoms.  Patient is a primary historian and the quality of the history appears reliable.    The history is provided by the patient. No language interpreter was used.       Physical Exam     Vitals signs and nursing note reviewed:  Vitals:    02/13/23 0507 02/13/23 0551   BP: 109/78    Pulse: 91 85   Resp: 18 16   Temp: 98.1 F (36.7 C)    TempSrc: Oral    SpO2: 97% 98%   Weight: 81.6 kg (180  lb)    Height: 1.905 m (6\' 3" )       Physical Exam  Constitutional:       General: He is not in acute distress.     Appearance: Normal appearance. He is not ill-appearing, toxic-appearing or diaphoretic.   HENT:      Head: Normocephalic and atraumatic.      Right Ear: External ear normal.      Left Ear: External ear normal.      Nose: Nose normal.      Mouth/Throat:      Pharynx: Oropharynx is clear.   Eyes:      Conjunctiva/sclera: Conjunctivae normal.   Cardiovascular:      Rate and Rhythm: Normal rate and regular rhythm.   Pulmonary:      Effort: Pulmonary effort is normal. No respiratory distress.   Musculoskeletal:      Right shoulder: No swelling or tenderness. Normal range of motion. Normal strength.      Right elbow: No swelling. Normal range of motion. Tenderness present.      Right wrist:  Normal. No swelling or tenderness. Normal pulse.      Right hand: Normal. No swelling. Normal strength. Normal sensation. Normal capillary refill. Normal pulse.        Arms:       Cervical back: Normal range of motion.      Comments: Tenderness to palpation over the medial aspect of the right elbow, full flexion and extension.  No swelling, erythema, warmth, streaking, or signs of infection.  No swelling of the bursa.  2+ palpable radial pulse with brisk capillary refill present.   Neurological:      Mental Status: He is alert.        Procedures     Procedures    No orders of the defined types were placed in this encounter.       Medications given during this emergency department visit:  Medications   ketorolac (TORADOL) injection 30 mg (30 mg IntraMUSCular Given 02/13/23 0535)       Discharge Medication List as of 02/13/2023  5:42 AM        START taking these medications    Details   predniSONE (DELTASONE) 50 MG tablet Take 1 tablet by mouth daily for 5 days, Disp-5 tablet, R-0Normal              No past medical history on file.     No past surgical history on file.     Social History     Socioeconomic History    Marital status: Single   Tobacco Use    Smoking status: Never    Smokeless tobacco: Never   Substance and Sexual Activity    Alcohol use: Yes    Drug use: Never        Discharge Medication List as of 02/13/2023  5:42 AM        CONTINUE these medications which have NOT CHANGED    Details   meloxicam (MOBIC) 15 MG tablet Take 1 tablet by mouth daily for 14 days, Disp-14 tablet, R-0Print              No results found for any visits on 02/13/23.      No orders to display                No results for input(s): "COVID19" in the last 72 hours.    Voice dictation software was used during the making of this note.  This software is not perfect and grammatical and other typographical errors may be present.  This note has not been completely proofread for errors.     Vanessa Durham, PA-C  02/13/23 [phone]

## 2023-02-13 NOTE — ED Notes (Signed)
Patient mobility status  with no difficulty. Provider aware     I have reviewed discharge instructions with the patient.  The patient verbalized understanding.    Patient left ED via Discharge Method: ambulatory to Home with  self .    Opportunity for questions and clarification provided.     Patient given 1 electronic scripts.           Quinn Plowman, LPN  13/08/65 [id number]

## 2023-02-13 NOTE — Discharge Instructions (Addendum)
Please begin taking the prednisone as prescribed.  Please follow-up with orthopedics, their numbers listed above.  Return to the ED with any new or worsening symptoms.

## 2023-02-13 NOTE — ED Triage Notes (Signed)
c\o right pain states this is chronic for the last 2 month

## 2023-02-14 ENCOUNTER — Inpatient Hospital Stay: Admit: 2023-02-14

## 2023-02-14 NOTE — Progress Notes (Signed)
Adam Snyder  DOB: 28-Jun-1986  Primary:  (Self-Pay)  Secondary:  [name]  [address]  [place] [address]  Phone: [phone]  Fax: [phone] Plan Frequency: twice a week for 10 weeks    Plan of Care/Certification Expiration Date: 04/10/23      Plan of Care/Certification Expiration Date:  Plan of Care/Certification Expiration Date: 04/10/23    Frequency/Duration:   Plan Frequency: twice a week for 10 weeks      Time In/Out:   Time In: 1100  Time Out: 1140    PT Visit Info:    Plan Frequency: twice a week for 10 weeks  Progress Note Counter: 6  No Show: 1        Visit Count:  6     OUTPATIENT PHYSICAL THERAPY:OP NOTE TYPE: Treatment Note 01/24/2023       Charge Capture   Episode              Treatment Diagnosis:    Cervical pain (neck)  Acute pain of right shoulder  Pain in right arm  Medical/Referring Diagnosis:  Neck pain     Referring Physician:  Eula Flax, PA  MD Orders:  PT Eval and Treat   Return MD Appt:  unspecified  Date of Onset:  Onset Date: 12/26/22     Allergies:   Patient has no known allergies.  Restrictions/Precautions:    None  Interventions Planned See assessment note.         Subjective Assessment: Patient reporting he has had a good bit of swelling and pain since last visit.  He reports his MRI on this Thursday.     Progress Note Counter: 6     Initial Pain Level:}    8/10  Post Session Pain Level:       8/10  Medications Last Reviewed:  02/14/2023  Updated Objective Findings:  None Today  Treatment   Therapeutic Exercise: ( 39 minutes ):  Exercises per grid below to improve mobility and strength.                                    Most recent tx:   Date:  02-14-23 Date;02/09/23   Date:  02/06/2023 Date:  02/02/2023 Date:  01-26-23   Activity/Exercise     Parameters   UBE Level 4  4/4    L4 resistance for 4 minutes forward, 4 minutes backward L4 resistance for 4 minutes forward, 4 minutes backward    Pulleys  X 20 B forward   X 20 B scaption     20 reps/1 set into flexion then scaption with cues to move through maximum pain-free range of motion as available     Shoulder flexion AAROM With 2# wand   X 20 - lots of cues to slow down  With 1 lb wand; 20 reps/1 set with cues for maximizing motion as able With 1 lb wand; 20 reps/1 set with cues for maximizing motion as able     Scapular squeezes  In sitting   X 20 B 20 reps/1 set in sitting with cues to avoid shoulder elevation  20 reps/1 set in sitting with cues to avoid shoulder elevation X 10 in sitting   X 10 in supine    Elbow flexion Supine with 2# wand   2 x 10 B       Supine ER  2#    2 x 10 B       Chin tucks      10 x 5 sec hold in supine    1st rib mob      With sheet  Attempted- patient said too painful    Side lying - shoulder abduction 2 x 10 R        Side lying - shoulder horizontal abduction 2 x 10 R        Sidelying shoulder ER 0# 2 x 10 R    1#  2 x 10 R 1 lb dumbbell; 20 reps/1 set R UE with cues to avoid painful range  1 lb dumbbell; 20 reps/1 set R UE with cues to avoid painful range    Supine shoulder CW/CCW circles Sitting- backward circles   X 20 B No weight; 20 reps/1 set each direction R UE with cues for control  No weight; 20 reps/1 set each direction R UE with cues for control    Pendulum      Circles - CW, CCW  Side to side  Back and forth   X 10 R each direction with cues to relax    Shoulder elevation/hold/depression 2 x 10 B   15 reps/1 set with slow lifting, holding, then lowering all the way down    Upper trap stretch in sitting    30 second hold x 5 reps to L with R hand anchored at mat    Supine perturbations, cues for downward scapular retractions, cues for upper trap inhibition, lower trap activation   X 30 serratus punches #2    10 x 10 sec holds A-P #2    10 x 10 secs holds M-L #2      *given in HEP  MedBridge Portal   Pt given following band colors:  Yellow           Red           Green           Blue           Grey       NEUROMUSCULAR RE-EDUCATION: ( minutes):     Exercise/activities per grid below to improve balance, coordination, kinesthetic sense, posture and proprioception.  Required no visual, verbal, manual and tactile cues to promote motor control of bilateral, upper extremity(s)    Manual Therapy: ( minutes)  With pt in supported supine position:   - passive ROM into R shoulder flexion, abduction, then ER at 15, 45, and 90 degrees abduction to maximize range and reduce muscular tightness and pain   - gentle STM to R biceps and pectoral musculature with cross friction massage at biceps tendon to reduce muscular tightness and pain    Modalities: (0 minutes)    With pt in supported sitting position, vasopneumatic compression with cold therapy via Game Ready system applied to R shoulder with medium pressure and at 38 degrees to reduce pain, swelling, and inflammation.  Edema Measurements:  Visual observation of edema surrounding R shoulder and upper arm   Effects of Edema on Function:  Shoulder mobility and posture.      Treatment/Session Summary:    Treatment Assessment:  Patient wincing when moving in certain planes.    Rubbing on medial aspect of elbow quite frequently. He is able to use cell phone is hand with no wincing. Getting MRI on Thursday, but come back to PT on Friday.     Communication/Consultation:  None today  Equipment provided today:  None  Recommendations/Intent for next treatment session: Next visit will focus on manual therapy/modalities as needed to reduce pain; work on scapular and shoulder strengthening; cervical mobility.    Total Treatment Billable Duration: 39 minutes   Time In: 1100  Time Out: 1140    Phoebe Sharps, PTA       Charge Capture  MedBridge Portal  Appt Desk     Future Appointments   Date Time Provider Department Center   02/16/2023  1:00 PM Eula Flax, [place] [facility]   02/17/2023  2:30 PM Wendall Mola B, PT [facility]

## 2023-02-16 ENCOUNTER — Ambulatory Visit: Admit: 2023-02-16 | Discharge: 2023-02-16 | Attending: Physician Assistant

## 2023-02-16 DIAGNOSIS — M5412 Radiculopathy, cervical region: Secondary | ICD-10-CM

## 2023-02-16 NOTE — Progress Notes (Signed)
02/16/23        Name: Adam Snyder  Date of Birth: [birth year]  Gender: male  MRN: [id number]    CC: Follow-up       HPI: [name] is a 37 y.o. male who returns for Follow-up         Patient returns the office today for follow-up.  I last saw him in the office 01/02/2023.  He has been going to physical therapy for the last 6 weeks, reports no significant improvement in his pain in the right shoulder and arm.  He is having pain and difficulty now with just holding things in his right arm causing shoulder pain.  His symptoms are grossly the same compared to when I saw him previously.    History was obtained by patient      Meds/PSH/PMH/FH/SH: This information has been reviewed.      ALLERGIES: No Known Allergies           Physical Examination:            Imaging:         MRI Result (most recent):  No results found for this or any previous visit from the past 3650 days.            Assessment and Plan    I recommend a cervical MRI for further evaluation given his failed conservative management of his symptoms, to rule out a cervical radiculopathy.  Will also refer him to an orthopedist for evaluation of his right shoulder to see if it could be contributing to his symptoms.  I will see the patient back in the office after the MRI to review the results.

## 2023-02-17 ENCOUNTER — Inpatient Hospital Stay: Admit: 2023-02-17

## 2023-02-17 NOTE — Progress Notes (Addendum)
Adam Snyder  DOB: 02/03/1986  Primary:  (Self-Pay)  Secondary:  [name]  [address]  [place] [address]  Phone: [phone]  Fax: [phone] Plan Frequency: twice a week for 10 weeks    Plan of Care/Certification Expiration Date: 04/10/23      Plan of Care/Certification Expiration Date:  Plan of Care/Certification Expiration Date: 04/10/23    Frequency/Duration:   Plan Frequency: twice a week for 10 weeks      Time In/Out:   Time In: 1430  Time Out: 1510    PT Visit Info:    Plan Frequency: twice a week for 10 weeks  Progress Note Counter: 7  No Show: 1        Visit Count:  7     OUTPATIENT PHYSICAL THERAPY:OP NOTE TYPE: Treatment Note 01/24/2023       Charge Capture   Episode              Treatment Diagnosis:    Cervical pain (neck)  Acute pain of right shoulder  Pain in right arm  Medical/Referring Diagnosis:  Neck pain     Referring Physician:  Eula Flax, PA  MD Orders:  PT Eval and Treat   Return MD Appt:  unspecified  Date of Onset:  Onset Date: 12/26/22     Allergies:   Patient has no known allergies.  Restrictions/Precautions:    None  Interventions Planned See assessment note.         Subjective Assessment: Patient stating he will have to leave 15 minutes early today so he can go to his MD office. States he now has a second lawyer involved in his case    Progress Note Counter: 7     Initial Pain Level:}    8/10  Post Session Pain Level:        (no change in pain reported)/10  Medications Last Reviewed:  02/17/2023  Updated Objective Findings:  None Today  Treatment   Therapeutic Exercise: ( 25 minutes ):  Exercises per grid below to improve mobility and strength.                                    Most recent tx:   Date:  02/17/2023 Date:  02-14-23 Date;02/09/23   Date:  02/06/2023 Date:  02/02/2023 Date:  01-26-23   Activity/Exercise      Parameters   UBE L4 resistance for 4 minutes forward, 4 minutes backward Level 4  4/4    L4 resistance for 4 minutes  forward, 4 minutes backward L4 resistance for 4 minutes forward, 4 minutes backward    Pulleys  20 reps/1 set into flexion then scaption with cues to move through maximum pain-free range of motion as available X 20 B forward   X 20 B scaption    20 reps/1 set into flexion then scaption with cues to move through maximum pain-free range of motion as available     Shoulder flexion AAROM With 3 lb wand; 20 reps/1 set with cues for maximizing motion as able - encouraging pt to go through full motion With 2# wand   X 20 - lots of cues to slow down  With 1 lb wand; 20 reps/1 set with cues for maximizing motion as able With 1 lb wand; 20 reps/1 set with cues for maximizing motion as able     Scapular  squeezes   In sitting   X 20 B 20 reps/1 set in sitting with cues to avoid shoulder elevation  20 reps/1 set in sitting with cues to avoid shoulder elevation X 10 in sitting   X 10 in supine    Elbow flexion  Supine with 2# wand   2 x 10 B       Supine ER  2#    2 x 10 B       Chin tucks       10 x 5 sec hold in supine    1st rib mob       With sheet  Attempted- patient said too painful    Side lying - shoulder abduction  2 x 10 R        Side lying - shoulder horizontal abduction 20 reps/1 set R UE with cues to move throughout full available motion 2 x 10 R        Sidelying shoulder ER  0# 2 x 10 R    1#  2 x 10 R 1 lb dumbbell; 20 reps/1 set R UE with cues to avoid painful range  1 lb dumbbell; 20 reps/1 set R UE with cues to avoid painful range    Supine shoulder CW/CCW circles  Sitting- backward circles   X 20 B No weight; 20 reps/1 set each direction R UE with cues for control  No weight; 20 reps/1 set each direction R UE with cues for control    Pendulum       Circles - CW, CCW  Side to side  Back and forth   X 10 R each direction with cues to relax    Shoulder elevation/hold/depression  2 x 10 B   15 reps/1 set with slow lifting, holding, then lowering all the way down    Upper trap stretch in sitting     30 second hold x  5 reps to L with R hand anchored at mat    Supine perturbations, cues for downward scapular retractions, cues for upper trap inhibition, lower trap activation    X 30 serratus punches #2    10 x 10 sec holds A-P #2    10 x 10 secs holds M-L #2      *given in HEP  [employer] Portal   Pt given following band colors:  Yellow           Red           Green           Blue           Grey       NEUROMUSCULAR RE-EDUCATION: ( minutes):    Exercise/activities per grid below to improve balance, coordination, kinesthetic sense, posture and proprioception.  Required no visual, verbal, manual and tactile cues to promote motor control of bilateral, upper extremity(s)    Manual Therapy: ( minutes)  With pt in supported supine position:   - passive ROM into R shoulder flexion, abduction, then ER at 15, 45, and 90 degrees abduction to maximize range and reduce muscular tightness and pain   - gentle STM to R biceps and pectoral musculature with cross friction massage at biceps tendon to reduce muscular tightness and pain    Modalities: (8 minutes)    With pt in supported sitting position, vasopneumatic compression with cold therapy via Game Ready system applied to R shoulder with medium pressure and at 38 degrees to reduce pain, swelling,  and inflammation.  Edema Measurements:  Visual observation of edema surrounding R shoulder and upper arm   Effects of Edema on Function:  Shoulder mobility and posture.      Treatment/Session Summary:    Treatment Assessment:  Patient continuing to wince with R shoulder movement into elevation this session and initially self-limiting his motion significantly, but improved with significant therapist encouragement.     Communication/Consultation:  None today  Equipment provided today:  None  Recommendations/Intent for next treatment session: Next visit will focus on manual therapy/modalities as needed to reduce pain; work on scapular and shoulder strengthening; cervical mobility.    Total  Treatment Billable Duration: 33 minutes   Time In: 1430  Time Out: 1510    Jobe Gibbon, PT       Charge Capture  MedBridge Portal  Appt Desk     Future Appointments   Date Time Provider Department Center   02/24/2023 12:15 PM [facility]   03/07/2023  1:00 PM Jobe Gibbon, PT [facility]   03/07/2023  3:30 PM Milinda Antis, MD [facility]   03/09/2023  1:00 PM Jobe Gibbon, PT [facility]   03/13/2023  1:00 PM Phoebe Sharps, PTA [facility]   03/16/2023  1:00 PM Jobe Gibbon, PT [facility]

## 2023-02-24 ENCOUNTER — Ambulatory Visit: Admit: 2023-02-24 | Discharge: 2023-02-24

## 2023-02-24 DIAGNOSIS — M5412 Radiculopathy, cervical region: Secondary | ICD-10-CM

## 2023-03-07 ENCOUNTER — Inpatient Hospital Stay

## 2023-03-07 ENCOUNTER — Encounter: Attending: Emergency Medicine

## 2023-03-07 NOTE — Other (Signed)
Adam Snyder  DOB: 1985-12-19  Primary:  (Self-Pay)  Secondary:  [name]  [address]  [place] [address]  Phone: [phone]  Fax: [phone] Plan Frequency: twice a week for 10 weeks    Plan of Care/Certification Expiration Date: 04/10/23      Plan of Care/Certification Expiration Date:  Plan of Care/Certification Expiration Date: 04/10/23    Frequency/Duration: Plan Frequency: twice a week for 10 weeks            Visit Count:  7               OUTPATIENT PHYSICAL THERAPY:             Discontinuation Summary 03/07/2023               Episode (R shoulder and neck pain)       Treatment Diagnosis:     Cervical pain (neck)  Acute pain of right shoulder  Pain in right arm  Medical/Referring Diagnosis:    Neck pain     Referring Physician:  Eula Flax, PA  MD Orders:  PT Eval and Treat   Return MD Appt:  unspecified  Date of Onset:  Onset Date: 12/26/22      Allergies:  Patient has no known allergies.  Restrictions/Precautions:    None  Medications Last Reviewed:  03/07/2023     ASSESSMENT   Adam Snyder has been seen in physical therapy from 01/24/2023 to 02/17/2023 for 7 attended visits (pt with one same-day cancellation and 4 no shows).   Treatment has been discontinued at this time due to patient failing to return for additional treatment.  The below goals were not reassessed secondary to pt failing to attend additional sessions prior to discharge.  Thank you for this referral.   Therapy Problem List: (Impacting functional limitations):          Increased Pain, Decreased Strength, Decreased ROM, Decreased Functional Mobility, Decreased Independence with Home Exercise Program, Decreased Posture, Decreased Body Mechanics, Difficulty Sleeping, Decreased Activity Tolerance/Endurance*, and Decreased Tolerance to Work Activity     Therapy Prognosis:         Fair     Initial Assessment Complexity:         Moderate Complexity    PLAN   Effective Dates: 01/24/2023 TO  Plan of Care/Certification Expiration Date: 04/10/23     Frequency/Duration: Plan Frequency: twice a week for 10 weeks     Interventions Planned (Treatment may consist of any combination of the following):    Bed Mobility, Endurance Training, Building services engineer, Home Exercise Program (HEP), Manual Lymphatic Drainage, Manual Therapy, Neuromuscular Re-education/Strengthening, Pain Management, Positioning, Modalities: and   Vasopneumatic Device, Range of Motion (ROM), Return to Ryder System, Therapeutic Activites, Therapeutic Exercise/Strengthening, and Patient/Caregiver Education & Training       Goals: (Goals have been discussed and agreed upon with patient.)  Short-Term Functional Goals: Time Frame: 5 weeks  Pt will be compliant with HEP in order to increase cervical mobility and UE strength/endurance/mobility to improve functional mobility and overall quality of life.  Pt will improve score on the Disabilities of the Arm, Shoulder, and Hand (DASH) Questionnaire to 35/55 in order to improve independence with ADLs and IADLs and to improve overall quality of life.  Pt will reduce score on Neck Disability Index to 14/50 in order to demonstrate improved function during activities of daily living.  Pt will report lying on right side for 20  minutes with minimal to no increase in pain in order to improve sleep.  Pt will be able to demonstrate good body mechanics while lifting 3 lb object up from the floor in order to minimize pain while lifting groceries.  Discharge Goals: Time Frame: 10 weeks  Pt will be independent with HEP in order to increase cervical mobility and UE strength/endurance/mobility to improve functional mobility and overall quality of life.  Pt will improve score on the Disabilities of the Arm, Shoulder, and Hand (DASH) Questionnaire to 28/55 in order to improve independence with ADLs and IADLs and to improve overall quality of life.  Pt will reduce score on Neck Disability Index to  11/50 in order to demonstrate improved function during activities of daily living.  Pt will report lying on right side for 30 minutes with minimal to no increase in pain in order to improve sleep.  Pt will be able to demonstrate good body mechanics while lifting 10 lb object up from the floor in order to minimize pain while lifting equipment at his job.         OUTCOME MEASURE:   Disabilities of the Arm, Shoulder and Hand (DASH) Questionnaire - Quick Version:   Score:  Initial: 42/55    Interpretation of Score: The DASH is designed to measure the activities of daily living in person's with upper extremity dysfunction or pain.  Each section is scored on a 1-5 scale, 5 representing the greatest disability.  The scores of each section are added together for a total score of 55.    Neck Disability Index (NDI):   Score:  Initial: 17/50    Interpretation of Score: The Neck Disability Index is a revised form of the Oswestry Low Back Pain Index and is designed to measure the activities of daily living in person's with neck pain.  Each section is scored on a 0-5 scale, 5 representing the greatest disability.  The scores of each section are added together for a total score of 50.         Thank you for this referral,  Jobe Gibbon, PT          Charge Capture   Appt Desk

## 2023-03-09 ENCOUNTER — Encounter

## 2023-03-10 ENCOUNTER — Telehealth

## 2023-03-10 NOTE — Telephone Encounter (Signed)
New PT order in the system

## 2023-03-10 NOTE — Telephone Encounter (Signed)
Patient needs a new PT order

## 2023-03-13 ENCOUNTER — Encounter

## 2023-03-16 ENCOUNTER — Encounter

## 2023-03-22 NOTE — Telephone Encounter (Signed)
Pt  called and  ask that  you  re fax  his  therapy  order  to    Fax  [phone]

## 2023-03-23 ENCOUNTER — Encounter

## 2023-03-23 ENCOUNTER — Inpatient Hospital Stay

## 2023-03-23 DIAGNOSIS — M542 Cervicalgia: Secondary | ICD-10-CM

## 2023-03-23 NOTE — Other (Incomplete)
Adam Snyder  DOB: 1986-02-02  Primary:  (Self-Pay)  Secondary:  [name]  [address]  [place] [address]  Phone: [phone]  Fax: [phone]           Plan of Care/Certification Expiration Date:     Frequency/Duration:      Time In/Out:          PT Visit Info:         Visit Count:  Visit count could not be calculated. Make sure you are using a visit which is associated with an episode.                OUTPATIENT PHYSICAL THERAPY:             Initial Assessment 03/23/2023               Episode (No data found)         Treatment Diagnosis:     Neck pain  Radiculopathy, cervical region  Medical/Referring Diagnosis:    Cervical radiculopathy    Referring Physician:  Eula Flax, PA  MD Orders:  PT Eval and Treat   Return MD Appt:    Date of Onset:      Allergies:  Patient has no known allergies.  Restrictions/Precautions:    None      Medications Last Reviewed:  03/23/2023     SUBJECTIVE   History of Injury/Illness (Reason for Referral):  ***  Patient Stated Goal(s):  "***"  Initial Pain Level:       /10   Post Session Pain Level:      /10  Past Medical History/Comorbidities:   Adam Snyder  has no past medical history on file.  Adam Snyder  has no past surgical history on file.  Social History/Living Environment:   Patient lives with their family    Prior Level of Function/Work/Activity:   Prior Level of Function: Independent      Learning:      Fall Risk Scale:         OBJECTIVE     NATURE OF CONDITION Date: 03/23/23 Date:    Highest level of pain     Lowest level of pain     Aggravating factors     Alleviating factors     Frequency of symptoms     Description of symptoms     Location of tenderness       RANGE OF MOTION Date: 03/23/23 Date:      RIGHT LEFT RIGHT LEFT   Cervical Extension     Cervical Flexion     Cervical Rotation       Cervical Lateral Flexion         STRENGTH Date: 03/23/23 Date:      RIGHT LEFT RIGHT LEFT   Shoulder FLEX       Shoulder ABD        Elbow FLEX       Elbow EXT       Wrist FLEX       Wrist EXT         NEUROLOGICAL SCREEN  Dermatomes: ***  Reflexes: ***    SPECIAL TESTS    -Cervical Radiculopathy:       -Spurling's Test: Positive/Negative: ***       -Cervical distraction test:       -Upper limb tension test:       -Cervical rotation <60 degrees to the ipsilateral side:       -  Sharp Purser (instability of the atlanto-axial joint):       -Cervical rotation lateral flexion test:   -Instability of the atlanto-axial joint:       -Sharp-purser test for transverse ligament:       -Alar ligament test (supine cervical rotation):  *Denies dysarthria, diplopia, drop attacks, dizziness, dysphagia ***   ASSESSMENT   Initial Assessment:  Pt is a 37 yo male who presents to the OPPT clinic with neck pain. Pt PMH is unremarkable. 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 Pt s/s are consistent with neck pain secondary to   . Pt would benefit from skilled PT in order to address his impairments.     Therapy Problem List: (Impacting functional limitations):    Increased Pain, Decreased Strength, Decreased ROM, Decreased Functional Mobility, Decreased Independence with Home Exercise Program, Decreased Posture, Decreased Body Mechanics, and Decreased Activity Tolerance/Endurance*   Therapy Prognosis:   {Therapy Prognosis:64767}     Initial Assessment Complexity:   {Assessment Complexity IONGE:95284}       PLAN   Effective Dates: 03/23/2023 TO     Frequency/Duration:      Interventions Planned (Treatment may consist of any combination of the following):    Functional Mobility Training, Home Exercise Program (HEP), Manual Therapy, Neuromuscular Re-education/Strengthening, Pain Management, Positioning, Range of Motion (ROM), Therapeutic Activites, and Therapeutic Exercise/Strengthening   Goals: (Goals have been discussed and agreed upon with  patient.)  Short-Term Functional Goals: Time Frame: 4 weeks   Pt will achieve a ______NDI score in order to improve ADLs and function   Pt will be compliant with HEP   Pt will achieve a VAS pain score of ***/10 in order to improve ADLS and function   Pt will improve all BUE Strength to ***/5 in order to improve ADLs and function   Discharge Goals: Time Frame: 8 weeks   Pt will achieve a _______ NDI score in order to improve ADLs and function   Pt will be independent with HEP   Pt will achieve a VAS pain score of 0/10 in order to improve ADLS and function   Pt will improve all BUE Strength to 5/5 in order to improve ADLs and function        Outcome Measure:   Tool Used: Neck Disability Index (NDI)  Score:  Initial: ***/50  Most Recent: X/50 (Date: -- )   Interpretation of Score: The Neck Disability Index is a revised form of the Oswestry Low Back Pain Index and is designed to measure the activities of daily living in person's with neck pain.  Each section is scored on a 0-5 scale, 5 representing the greatest disability.  The scores of each section are added together for a total score of 50.     Medical Necessity:   > Skilled intervention continues to be required due to Skilled Physical Therapy services are needed for the pt's deficits above that affect the pt to perform ADLs, participate within their community, and to perform functional movement patterns.    Reason For Services/Other Comments:  > Patient continues to require skilled intervention due to Skilled Physical Therapy services are needed for the pt's deficits above that affect the pt to perform ADLs, participate within their community, and to perform functional movement patterns.      Regarding Otilio Saber therapy, I certify that the treatment plan above will be carried out by  a therapist or under their direction.  Thank you for this referral,  Marcial Pacas, PT     Referring Physician Signature: Eula Flax, PA _______________________________ Date  _____________        Charge Capture  Appt Desk  Attendance Report

## 2023-03-23 NOTE — Progress Notes (Incomplete)
Adam Snyder  DOB: [birth year]  Primary:  (Self-Pay)  Secondary:  Adam Snyder  [address]  [place] [address]  Phone: [phone]  Fax: [phone]           Plan of Care/Certification Expiration Date:     Frequency/Duration:      Time In/Out:          PT Visit Info:         Visit Count:  Visit count could not be calculated. Make sure you are using a visit which is associated with an episode.    OUTPATIENT PHYSICAL THERAPY:   Treatment Note 03/23/2023       Episode  (No data found)               Treatment Diagnosis:    Neck pain  Radiculopathy, cervical region  Medical/Referring Diagnosis:    Cervical radiculopathy    Referring Physician:  Eula Flax, PA  MD Orders:  PT Eval and Treat   Return MD Appt:     Date of Onset:  Onset Date: 12/26/22     Allergies:   Patient has no known allergies.  Restrictions/Precautions:   None      Interventions Planned (Treatment may consist of any combination of the following):     See Assessment Note    Subjective Comments:   See eval   Initial Pain Level::      /10  Post Session Pain Level:        /10  Medications Last Reviewed:  03/23/2023  Updated Objective Findings:  See Evaluation Note from today  Treatment   THERAPEUTIC EXERCISE: ( minutes):    Exercises per grid below to improve mobility, strength, balance and coordination.  Required no visual, verbal, manual and tactile cues to promote proper body alignment, promote proper body posture, promote proper body mechanics and promote proper body breathing techniques.  Progressed resistance, range, repetitions and complexity of movement as indicated.    THERAPEUTIC ACTIVITY: (  minutes):    Therapeutic activities per grid below to improve mobility, strength, balance and coordination.  Required no visual, verbal, manual and tactile cues to promote motor control of bilateral, lower extremity(s).    NEUROMUSCULAR RE-EDUCATION: ( minutes):    Exercise/activities per grid below to  improve balance, coordination, kinesthetic sense, posture and proprioception.  Required no visual, verbal, manual and tactile cues to promote motor control of bilateral, lower extremity(s).    MANUAL THERAPY: ( minutes):   Joint mobilization, Soft tissue mobilization, Manipulation and Manual lymphatic drainage was utilized and necessary because of the patient's restricted joint motion, painful spasm, loss of articular motion and restricted motion of soft tissue.     MODALITIES: ( minutes):         Therapy in order to provide analgesia, relieve muscle spasm and reduce inflammation and edema.             Date:  03/23/23 Date:   Date:     Activity/Exercise Parameters Parameters Parameters   Pt ed on biomechanics, PT POC, HEP, outcomes, prognosis, midback posture strengthening, C spine neutral spine posture, forward head posture education/midback posture orientation education, red flag s/s of cervical spine myelopathy    ***  Treatment/Session Summary:    Treatment Assessment:   ***  Communication/Consultation:  None today  Equipment provided today:  None  Recommendations/Intent for next treatment session: Next visit will focus on BUE strengthening, midback posture strengthening and neutral spine posture strengthening.    >Total Treatment Billable Duration:  *** minutes + eval non-timed       Marcial Pacas, PT         Charge Capture  MedBridge Portal  Appt Desk  Attendance Report     Future Appointments   Date Time Provider Department Center   03/23/2023 10:15 AM Marcial Pacas, PT [facility]   04/18/2023 10:45 AM Milinda Antis, MD [facility]

## 2023-03-23 NOTE — Telephone Encounter (Signed)
PT order faxed.

## 2023-03-24 NOTE — Telephone Encounter (Signed)
Faxed referral to [facility]

## 2023-03-24 NOTE — Telephone Encounter (Signed)
Pt  called and ask that   his  P[facility] therapy  order  be  faxed to  [employer] [facility]    where he  works.    Fax  [phone]

## 2023-03-28 ENCOUNTER — Telehealth

## 2023-03-28 NOTE — Telephone Encounter (Signed)
PT order refaxed to [phone]

## 2023-03-28 NOTE — Telephone Encounter (Signed)
Can you  refax the [facility]  PT order?  They are  telling pt  today  that they do not  have it.Marland KitchenMarland Kitchen

## 2023-03-29 NOTE — Telephone Encounter (Signed)
Pt called back, Adam Snyder says they need the PT referral to have there name on it. Could you refax ?

## 2023-03-29 NOTE — Telephone Encounter (Signed)
Notes faxed.

## 2023-03-29 NOTE — Telephone Encounter (Signed)
External referral re faxed with name in the referral

## 2023-03-29 NOTE — Addendum Note (Signed)
Addended by: Jason Fila on: 03/29/2023 09:02 AM     Modules accepted: Orders

## 2023-03-29 NOTE — Progress Notes (Signed)
Patient requested Eula Flax PA notes to [facility] for his therapy fax [phone].

## 2023-03-29 NOTE — Telephone Encounter (Signed)
She needs H & P, recent notes and meds list to get him processed. Please fax to #  [phone]

## 2023-04-18 ENCOUNTER — Ambulatory Visit: Admit: 2023-04-18 | Discharge: 2023-04-18 | Attending: Emergency Medicine

## 2023-04-18 DIAGNOSIS — M67813 Other specified disorders of tendon, right shoulder: Secondary | ICD-10-CM

## 2023-04-18 MED ORDER — DICLOFENAC SODIUM 50 MG PO TBEC
50 | ORAL_TABLET | Freq: Two times a day (BID) | ORAL | 1 refills | Status: AC | PRN
Start: 2023-04-18 — End: 2023-06-17

## 2023-04-18 NOTE — Progress Notes (Signed)
Adam Snyder is a right hand dominant male who presents with acute pain and weakness in the right shoulder. He recalls feeling a pop in the anterior shoulder after being detained by an officer on 12/26/22. He complains today of worsening pain and weakness. He was evaluated by Eula Flax, PA in our office and referred here for further evaluation of the shoulder. He has been attending formal therapy at our [place] office. He is currently working as a Advertising copywriter. He is right hand dominant.

## 2023-04-18 NOTE — Progress Notes (Signed)
Name: Adam Snyder  Date of Birth: 10/29/1986  Gender: male  MRN: [id number]  Date of Encounter:  04/18/2023       CHIEF COMPLAINT:     Chief Complaint   Patient presents with    Shoulder Pain     Right        SUBJECTIVE/OBJECTIVE:      HPI:    Adam Snyder  is a 37 y.o. pleasant male who presents today for a new evaluation of his right shoulder pain.    [name] reports acute shoulder pain that is anterior to the shoulder. Pain began February 26th when his arm was pulled back by a Emergency planning/management officer. He reports feeling a pop. He has since had pain and weakness in the shoulder. He reported numbness in the arm as well. He was previously seen by Eula Flax in our practice and had an MR of the cervical spine ordered and completed. 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 He was referred to PT at [facility]. [place] downtown. He reports using no medications for pain. In today's visit, [name] is hard to get history from today, he fell asleep in exam room, speaks to me with his eyes closed and mumbles.      PAST HISTORY:   Past medical, surgical, family, social history and allergies reviewed by me.   Pertinent history: alcohol dependence  Tobacco use:  reports that he has never smoked. He has never used smokeless tobacco.  Diabetes: none  No results found for: "LABA1C"  Anticoagulation: no      REVIEW OF SYSTEMS:   As noted in HPI.     PHYSICAL EXAMINATION:     Gen: Well-developed, no acute distress. Patient is sleeping in exam room on entrance. Poor eye contact.   HEENT: NC/AT, EOMI   Neck: Trachea midline, normal ROM   CV: Regular rhythm by palpation of distal pulse, normal capillary refill   Pulm: No respiratory distress, no stridor   Psychiatric: Well oriented, normal mood and affect.   Skin: No rashes, lesions or ulcers, normal temperature, turgor, and texture on uninvolved extremity.      ORTHO EXAM:    Right Shoulder:     Inspection: no shoulder deformity or erythema. There is the appearance of needle marks on  both anterior cubital fossas present.   ROM: Active / passive forward flexion 180  Active / passive abduction 170  Internal rotation 70  External rotation 90  Tenderness: there is generalized tenderness to the upper trapezius, ac joint, cuff footprint, bicipital groove, upper arm  Strength: Abduction 4/5, External rotation 4/5, Internal rotation 4/5  Provocative tests: Jobes is positive, speeds positive, biceps load testing is painful. He does not tolerate obriens testing. Belly lift off is painful.   Normal capillary refill / 2+ radial pulse   Sensation intact to light touch          DIAGNOSTIC IMAGING:     X-ray 3 vw RIGHT shoulder Grashey / AP / outlet taken previously by another physician independently reviewed.     Findings: No fracture or osseous lesion.  There is mild AC joint space narrowing.  Glenohumeral joint spaces preserved.  Right chest wall appears clear.  Impression: AC joint narrowing, no acute osseous abnormality    I independently interpreted XR taken today    ASSESSMENT/PLAN:   1. Biceps tendonosis of right shoulder    2. Shoulder injury, initial encounter       Today we discussed imaging and examination findings,  diagnosis, treatment, and expected prognosis.  He exhibits a lot of generalized shoulder pain on exam today. Given the nature of the injury it is possible that he sustained a biceps or labral injury. I have advised he return to physical therapy for the shoulder and I would recommend NSAID orally. We may consider injection. Further imaging may be advised if he does not get improvement. I do think he is a complicated case given his medical history.     Medications discussed today for pain relief:   Diclofenac 50mg  BID RX    Orders / medications today:   Orders Placed This Encounter    Ambulatory referral to Physical Therapy     Referral Priority:   Routine     Referral Type:   Physical Therapy     Referral Reason:   Patient Preference     Number of Visits Requested:   1    diclofenac  (VOLTAREN) 50 MG EC tablet     Sig: Take 1 tablet by mouth 2 times daily as needed for Pain     Dispense:  60 tablet     Refill:  1      Follow up: Return in about 8 weeks (around 06/13/2023).       The patient expressed understanding and agreed with the plan.     Vivianne Spence, MD   Orthopaedics and Sports Medicine  [facility]     This document was created using voice recognition software so frequent mistakes are possible. For any concerns about the wording of this document, please contact its creator for further clarification.

## 2023-04-20 ENCOUNTER — Encounter: Attending: Physician Assistant

## 2023-05-09 ENCOUNTER — Inpatient Hospital Stay: Admit: 2023-05-09

## 2023-05-09 DIAGNOSIS — M25511 Pain in right shoulder: Secondary | ICD-10-CM

## 2023-05-09 NOTE — Other (Signed)
Adam Snyder  DOB: [birth year]  Primary:  (Self-Pay)  Secondary:  Adam Snyder  [address]  [place] [address]  Phone: [phone]  Fax: [phone] Plan Frequency: 2x a week for up to 90 days    Plan of Care/Certification Expiration Date: 08/07/23        Plan of Care/Certification Expiration Date:  Plan of Care/Certification Expiration Date: 08/07/23    Frequency/Duration: Plan Frequency: 2x a week for up to 90 days      Time In/Out:   Time In: 0845  Time Out: 0918      PT Visit Info:    Total # of Visits to Date: 1  Progress Note Counter: 1      Visit Count:  1                OUTPATIENT PHYSICAL THERAPY:             Initial Assessment 05/09/2023               Episode (RUE Shldr Pain)         Treatment Diagnosis:     Right shoulder pain, unspecified chronicity  Biceps tendonosis of right shoulder  Medical/Referring Diagnosis:    Biceps tendonosis of right shoulder  Shoulder injury, initial encounter      Referring Physician:  Milinda Antis, MD  MD Orders:  PT Eval and Treat   Return MD Appt:    Date of Onset:      Allergies:  Patient has no known allergies.  Restrictions/Precautions:    None      Medications Last Reviewed:  05/09/2023     SUBJECTIVE   History of Injury/Illness (Reason for Referral):  Pt arrives to the OPPT clinic with RUE shldr pain. Pt reports he injured his RUE shldr Feb 2024 when a police officer pulled his arm back and since then reports increased RUE shldr pain. Pt reports pain in the anterior and posterior aspect of the RUE shldr pain. Pt reports that FLX/ABD AROM movements increased his pain in the anterior-posterior aspect of the RUE shldr. Pt reports that his ADLs are moderately-severely affected. Pt reports that he is back at work but does get pain everyday. Pt denies numbness and tingling. Pt denies any significant grip weakness.     Patient Stated Goal(s):  "Would like to move w/o RUE shldr pain"    Initial Pain Level:       /10   Post  Session Pain Level:      /10  Past Medical History/Comorbidities:   Adam Snyder  has no past medical history on file.  Adam Snyder  has no past surgical history on file.  Social History/Living Environment:   Patient lives with their family    Prior Level of Function/Work/Activity:   Prior Level of Function: Independent      Learning:   Does the patient/guardian have any barriers to learning?: No barriers  Will there be a co-learner?: No  What is the preferred language of the patient/guardian?: English  Is an interpreter required?: No  How does the patient/guardian prefer to learn new concepts?: Listening; Demonstration    Fall Risk Scale:   Morse Total Score: 0       OBJECTIVE     NATURE OF CONDITION Date: 05/09/23 Date:    Highest level of pain 10    Lowest level of pain 7    Aggravating factors AROM FLX/ABD movements  Alleviating factors rest    Frequency of symptoms Everyday     Description of symptoms Sharp/diffuse pain     Location of tenderness Anterior-lateral RUE shldr       RANGE OF MOTION Date: 05/09/23 Date:      RIGHT LEFT RIGHT LEFT   Shoulder FLEX 135** WNL     Shoulder ABD 135** WNL     Shoulder ER 55* WNL     Shoulder IR 80 WNL            Shoulder FLEX (PROM) 135 WNL     Shoulder ABD (PROM) 135 WNL       STRENGTH Date: 05/09/23 Date:      RIGHT LEFT RIGHT LEFT   Shoulder FLEX 2-** WNL     Shoulder ABD 2-** WNL     Shoulder ER  2-** WNL     Shoulder IR  3- WNL     Elbow FLEX 3- WNL     Elbow EXT 3- WNL       SPECIAL TESTS     Leanord Asal: positive  Neers Impingement:  negative  Joby's Empty Can: positive  ER sign:  positive  Painful Arc:  positive  O'Briens:  positive  Drop Arm: negative  Lift Off Sign (subscapularis):  positive  Anterior apprehension test: positive    NEUROLOGICAL SCREEN  Dermatomes: WNL  Reflexes: WNL    JOINT PLAY  Anterior: firm  Posterior: firm  Inferior: firm    SKIN INTEGRITY  Clean, dry, & intact   ASSESSMENT   Initial Assessment:  Pt is a 37 yo male who presents to the OPPT  clinic with RUE shldr pain. Pt PMH is unremarkable . Pt has received Dx imaging via xray which revealed, AC joint narrowing, no acute osseous abnormality. Pt s/s are consistent with RUE shldr pain secondary to potentially suspected labral involvement of the RUE shldr. Pt would benefit from skilled PT in order to address his impairments.     Therapy Problem List: (Impacting functional limitations):    Increased Pain, Decreased Strength, Decreased ROM, Decreased Functional Mobility, Decreased Independence with Home Exercise Program, Decreased Posture, Decreased Body Mechanics, and Decreased Activity Tolerance/Endurance*   Therapy Prognosis:   Good     Initial Assessment Complexity:   Moderate Complexity       PLAN   Effective Dates: 05/09/2023 TO Plan of Care/Certification Expiration Date: 08/07/23     Frequency/Duration: Plan Frequency: 2x a week for up to 90 days      Interventions Planned (Treatment may consist of any combination of the following):    Home Exercise Program (HEP), Manual Therapy, Neuromuscular Re-education/Strengthening, Pain Management, Positioning, Range of Motion (ROM), Therapeutic Activites, and Therapeutic Exercise/Strengthening   Goals: (Goals have been discussed and agreed upon with patient.)  Short-Term Functional Goals: Time Frame: 4 weeks   Pt will achieve a 34/55 QuickDash score in order to improve ADLs and function   Pt will be compliant with HEP   Pt will achieve a VAS pain score of 5/10 in order to improve ADLS and function   Pt will improve all BUE Strength to 3+/5 in order to improve ADLs and function   Discharge Goals: Time Frame: 8 weeks   Pt will achieve a 31/55 QuickDash score in order to improve ADLs and function   Pt will be independent with HEP   Pt will achieve a VAS pain score of 0/10 in order to improve ADLS and function   Pt will improve  all BUE Strength to 5/5 in order to improve ADLs and function        Outcome Measure:   Tool Used: Disabilities of the Arm, Shoulder and  Hand (DASH) Questionnaire - Quick Version  Score:  Initial: 38/55  Most Recent: X/55 (Date: -- )   Interpretation of Score: The DASH is designed to measure the activities of daily living in person's with upper extremity dysfunction or pain.  Each section is scored on a 1-5 scale, 5 representing the greatest disability.  The scores of each section are added together for a total score of 55.      Medical Necessity:   > Skilled intervention continues to be required due to Skilled Physical Therapy services are needed for the pt's deficits above that affect the pt to perform ADLs, participate within their community, and to perform functional movement patterns.    Reason For Services/Other Comments:  > Patient continues to require skilled intervention due to Skilled Physical Therapy services are needed for the pt's deficits above that affect the pt to perform ADLs, participate within their community, and to perform functional movement patterns.      Regarding Otilio Saber therapy, I certify that the treatment plan above will be carried out by a therapist or under their direction.  Thank you for this referral,  Marcial Pacas, PT     Referring Physician Signature: Milinda Antis, MD _______________________________ Date _____________        Charge Capture  Events  Appt Desk  Attendance Report

## 2023-05-09 NOTE — Progress Notes (Signed)
Adam Snyder  DOB: [birth year]  Primary:  (Self-Pay)  Secondary:  [name]  [address]  [place] [address]  Phone: [phone]  Fax: [phone] Plan Frequency: 2x a week for up to 90 days    Plan of Care/Certification Expiration Date: 08/07/23        Plan of Care/Certification Expiration Date:  Plan of Care/Certification Expiration Date: 08/07/23    Frequency/Duration: Plan Frequency: 2x a week for up to 90 days      Time In/Out:   Time In: 0845  Time Out: 0918      PT Visit Info:    Total # of Visits to Date: 1  Progress Note Counter: 1      Visit Count:  1    OUTPATIENT PHYSICAL THERAPY:   Treatment Note 05/09/2023       Episode  (RUE Shldr Pain)               Treatment Diagnosis:    Right shoulder pain, unspecified chronicity  Biceps tendonosis of right shoulder  Medical/Referring Diagnosis:    Biceps tendonosis of right shoulder  Shoulder injury, initial encounter    Referring Physician:  Milinda Antis, MD  MD Orders:  PT Eval and Treat   Return MD Appt:     Date of Onset:  Onset Date: 12/26/22     Allergies:   Patient has no known allergies.  Restrictions/Precautions:   None      Interventions Planned (Treatment may consist of any combination of the following):     See Assessment Note    Subjective Comments:   See eval   Initial Pain Level::4/10  Post Session Pain Level:  3/10  Medications Last Reviewed:  05/09/2023  Updated Objective Findings:  See Evaluation Note from today  Treatment   THERAPEUTIC EXERCISE: (23 minutes):    Exercises per grid below to improve mobility, strength, balance and coordination.  Required visual, verbal, manual and tactile cues to promote proper body alignment, promote proper body posture, promote proper body mechanics and promote proper body breathing techniques.  Progressed resistance, range, repetitions and complexity of movement as indicated.    THERAPEUTIC ACTIVITY: (  minutes):    Therapeutic activities per grid below to  improve mobility, strength, balance and coordination.Required visual, verbal, manual and tactile cues to promote motor control of bilateral, lower extremity(s).    NEUROMUSCULAR RE-EDUCATION: ( minutes):    Exercise/activities per grid below to improve balance, coordination, kinesthetic sense, posture and proprioception.  Required visual, verbal, manual and tactile cues to promote motor control of bilateral, lower extremity(s).    MANUAL THERAPY: ( minutes):   Joint mobilization, Soft tissue mobilization, Manipulation and Manual lymphatic drainage was utilized and necessary because of the patient's restricted joint motion, painful spasm, loss of articular motion and restricted motion of soft tissue.     MODALITIES: ( minutes):         Therapy in order to provide analgesia, relieve muscle spasm and reduce inflammation and edema.             Date:  05/09/23 Date:   Date:     Activity/Exercise Parameters Parameters Parameters   Pt ed on biomechanics, PT POC, HEP, outcomes, prognosis, R shldr differential dx labral involvement v RTC tears v biceps tears v OA/DJD of the RUE shldr complex, BUE strengthening education, midback posture strengthening, red flag s/s of ruptured soft tissue/muscle tissue/ligamentous clinical s.s education   23 mins  Treatment/Session Summary:    Treatment Assessment:   Pt does present with dynamic instability of the RUE shldr. Pt looks to have potentially labral involvement with instability and strength deficits with most isometric testing. Will progress prn  Communication/Consultation:  None today  Equipment provided today:  None  Recommendations/Intent for next treatment session: Next visit will focus on BUE strengthening, BUE ROM and midback posture strengthening.    >Total Treatment Billable Duration:  23 minutes + eval non timed  Time In: 0845  Time Out: 0918    Marcial Pacas, PT         Charge Capture  Events  [facility] Portal  Appt Desk  Attendance  Report     Future Appointments   Date Time Provider Department Center   05/18/2023  3:45 PM Marcial Pacas, PT [facility]   05/23/2023  1:30 PM Marcial Pacas, PT [facility]   05/30/2023  1:30 PM Marcial Pacas, PT [facility]   06/06/2023  1:30 PM Marcial Pacas, PT [facility]   06/13/2023 11:00 AM Milinda Antis, MD [facility]

## 2023-05-17 ENCOUNTER — Inpatient Hospital Stay: Admit: 2023-05-17 | Discharge: 2023-05-17 | Disposition: A | Discharge status: Home / Self Care

## 2023-05-17 ENCOUNTER — Emergency Department: Admit: 2023-05-17

## 2023-05-17 DIAGNOSIS — M542 Cervicalgia: Secondary | ICD-10-CM

## 2023-05-17 LAB — COVID-19, RAPID: SARS-CoV-2, Rapid: NOT DETECTED

## 2023-05-17 LAB — GROUP A STREP SCREEN BY PCR: Strep, Molecular: NOT DETECTED

## 2023-05-17 MED ORDER — METHYLPREDNISOLONE 4 MG PO TBPK
4 | PACK | ORAL | 0 refills | Status: AC
Start: 2023-05-17 — End: 2023-05-23

## 2023-05-17 MED ORDER — DEXAMETHASONE SODIUM PHOSPHATE 10 MG/ML IJ SOLN
10 | INTRAMUSCULAR | Status: AC
Start: 2023-05-17 — End: 2023-05-17
  Administered 2023-05-17: 22:00:00 10 mg via INTRAMUSCULAR

## 2023-05-17 MED FILL — DEXAMETHASONE SODIUM PHOSPHATE 10 MG/ML IJ SOLN: 10 MG/ML | INTRAMUSCULAR | Qty: 1

## 2023-05-17 NOTE — ED Triage Notes (Signed)
Pt ambulatory with c/o pain to the front of his throat since Monday that has progressively worsened. Pt reports it woke him from his sleep and caused him to have trouble breathing. Pt denies trouble swallowing. Pt does not appear to be in any acute distress at this time.

## 2023-05-17 NOTE — ED Provider Notes (Signed)
Emergency Department Provider Note       PCP: None, None   Age: 37 y.o.   Sex: male     DISPOSITION Decision To Discharge 05/17/2023 06:43:08 PM       ICD-10-CM    1. Neck pain  M54.2           Medical Decision Making     Patient presents with discomfort involving his anterior lower neck.  Arrives with stable vital signs, saturating 99% on room air.  Afebrile.  Exam demonstrates some tenderness over the thyroid cartilage.  His oropharynx is clear and airway is patent.  He has no difficulty swallowing and does not appear to be in any distress.  He has noted a dry cough over the past few days.  Suspect likely viral laryngitis, however, patient believes that there may be something obstructing his throat therefore I will obtain a soft tissue neck x-ray.    Swabs are negative.  No obstruction or radiopaque foreign body noted on x-ray.  Will plan to discharge him home with a steroid taper and supportive care otherwise.  Patient to follow-up with his primary care provider.     1 acute, uncomplicated illness or injury.  Prescription drug management performed.    I independently ordered and reviewed each unique test.       I interpreted the X-rays no foreign body or obstruction.          History     37 year old male with no stated past medical history presents to the emergency department with concern for anterior neck pain.  Patient reports he has been experiencing a dull pain primarily to the anterior lower neck localized primarily to the thyroid cartilage.  He reports this area is tender to palpation.  He denies any difficulty swallowing.  He states that the pain has caused him to feel short of breath at times.  He reports an associated dry cough over the past few days.  He denies any known fevers or ill contacts.  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  He has no further complaints.    The history is provided by the patient.       Physical Exam      Vitals signs and nursing note reviewed:  Vitals:    05/17/23 1643   BP: 112/74   Pulse: 84   Resp: 16   Temp: 97.5 F (36.4 C)   TempSrc: Oral   SpO2: 99%   Weight: 83.9 kg (185 lb)   Height: 1.905 m (6\' 3" )      Physical Exam  Vitals and nursing note reviewed.   Constitutional:       General: He is not in acute distress.     Appearance: Normal appearance. He is not ill-appearing or diaphoretic.   HENT:      Head: Normocephalic and atraumatic.      Right Ear: External ear normal.      Left Ear: External ear normal.      Nose: Nose normal.      Mouth/Throat:      Mouth: Mucous membranes are moist.      Pharynx: Oropharynx is clear. No oropharyngeal exudate or posterior oropharyngeal erythema.      Comments: No oropharyngeal edema. Airway is patent.  Eyes:      General: No scleral icterus.        Right eye: No discharge.  Left eye: No discharge.      Extraocular Movements: Extraocular movements intact.      Conjunctiva/sclera: Conjunctivae normal.   Neck:      Comments: Mild tenderness over the thyroid cartilage.  Cardiovascular:      Rate and Rhythm: Normal rate and regular rhythm.      Pulses: Normal pulses.      Heart sounds: Normal heart sounds.   Pulmonary:      Effort: Pulmonary effort is normal.      Breath sounds: Normal breath sounds.   Abdominal:      General: Abdomen is flat.      Palpations: Abdomen is soft.      Tenderness: There is no abdominal tenderness.   Musculoskeletal:         General: Normal range of motion.      Cervical back: Normal range of motion and neck supple. Tenderness present.   Skin:     General: Skin is warm and dry.   Neurological:      General: No focal deficit present.      Mental Status: He is alert and oriented to person, place, and time.   Psychiatric:         Mood and Affect: Mood normal.         Behavior: Behavior normal.        Procedures     Procedures    Orders Placed This Encounter   Procedures    COVID-19, Rapid    Group A Strep Screen By PCR    XR NECK SOFT  TISSUE        Medications given during this emergency department visit:  Medications   dexAMETHasone (DECADRON) injection 10 mg (10 mg IntraMUSCular Given 05/17/23 1734)       New Prescriptions    METHYLPREDNISOLONE (MEDROL, PAK,) 4 MG TABLET    Take by mouth.        History reviewed. No pertinent past medical history.     History reviewed. No pertinent surgical history.     Social History     Socioeconomic History    Marital status: Single     Spouse name: None    Number of children: None    Years of education: None    Highest education level: None   Tobacco Use    Smoking status: Never    Smokeless tobacco: Never   Substance and Sexual Activity    Alcohol use: Yes    Drug use: Never        Previous Medications    DICLOFENAC (VOLTAREN) 50 MG EC TABLET    Take 1 tablet by mouth 2 times daily as needed for Pain    MELOXICAM (MOBIC) 15 MG TABLET    Take 1 tablet by mouth daily for 14 days        Results for orders placed or performed during the hospital encounter of 05/17/23   COVID-19, Rapid    Specimen: Nasopharyngeal   Result Value Ref Range    Source Nasopharyngeal      SARS-CoV-2, Rapid Not detected NOTD     Group A Strep Screen By PCR    Specimen: Throat   Result Value Ref Range    Strep, Molecular Not detected           XR NECK SOFT TISSUE    (Results Pending)                Recent Labs     05/17/23  1730   COVID19 Not detected       Voice dictation software was used during the making of this note.  This software is not perfect and grammatical and other typographical errors may be present.  This note has not been completely proofread for errors.       Shon Baton [place], [place]  05/17/23 [phone]

## 2023-05-17 NOTE — ED Notes (Addendum)
Patient mobility status  with no difficulty. Provider aware     I have reviewed discharge instructions with the patient.  The patient verbalized understanding.    Patient left ED via Discharge Method: ambulatory to Home with  self .    Opportunity for questions and clarification provided.     Patient given 1 scripts.

## 2023-05-17 NOTE — Discharge Instructions (Addendum)
Your symptoms are felt to be related to a viral infection which is likely self-limited in nature.  Your swabs here were negative for COVID and strep.  X-ray was normal.  I have provided a prescription for a steroid taper to help with your symptoms in addition to the dose he received here.  You can try cool beverages, salt water rinses, and throat lozenges as well.    Please return with any new or worsening symptoms.    We would love to help you get a primary care doctor for follow-up after your emergency department visit.    Please call [phone] between 7AM - 6PM Monday to Friday.  A care navigator will be able to assist you with setting up a doctor close to your home.

## 2023-05-18 ENCOUNTER — Inpatient Hospital Stay: Admit: 2023-05-18

## 2023-05-18 NOTE — Progress Notes (Signed)
Adam Snyder  DOB: 07/21/1986  Primary:  (Self-Pay)  Secondary:  Adam Snyder  [address]  [place] [address]  Phone: [phone]  Fax: [phone] Plan Frequency: 2x a week for up to 90 days    Plan of Care/Certification Expiration Date: 08/07/23        Plan of Care/Certification Expiration Date:  Plan of Care/Certification Expiration Date: 08/07/23    Frequency/Duration: Plan Frequency: 2x a week for up to 90 days      Time In/Out:   Time In: 1541  Time Out: 1623      PT Visit Info:    Total # of Visits to Date: 2  Progress Note Counter: 2      Visit Count:  2    OUTPATIENT PHYSICAL THERAPY:   Treatment Note 05/18/2023       Episode  (RUE Shldr Pain)               Treatment Diagnosis:    Right shoulder pain, unspecified chronicity  Biceps tendonosis of right shoulder  Medical/Referring Diagnosis:    Biceps tendonosis of right shoulder  Shoulder injury, initial encounter    Referring Physician:  Milinda Antis, MD  MD Orders:  PT Eval and Treat   Return MD Appt:     Date of Onset:  Onset Date: 12/26/22     Allergies:   Patient has no known allergies.  Restrictions/Precautions:   None      Interventions Planned (Treatment may consist of any combination of the following):     See Assessment Note    Subjective Comments:   Pt reports diffuse soreness in the RUE Shldr     Initial Pain Level::4/10  Post Session Pain Level:  3/10  Medications Last Reviewed:  05/18/2023  Updated Objective Findings:  See Evaluation Note from today  Treatment   THERAPEUTIC EXERCISE: (23 minutes):    Exercises per grid below to improve mobility, strength, balance and coordination.  Required visual, verbal, manual and tactile cues to promote proper body alignment, promote proper body posture, promote proper body mechanics and promote proper body breathing techniques.  Progressed resistance, range, repetitions and complexity of movement as indicated.    THERAPEUTIC ACTIVITY: (  minutes):     Therapeutic activities per grid below to improve mobility, strength, balance and coordination.Required visual, verbal, manual and tactile cues to promote motor control of bilateral, lower extremity(s).    NEUROMUSCULAR RE-EDUCATION: ( minutes):    Exercise/activities per grid below to improve balance, coordination, kinesthetic sense, posture and proprioception.  Required visual, verbal, manual and tactile cues to promote motor control of bilateral, lower extremity(s).    MANUAL THERAPY: ( minutes):   Joint mobilization, Soft tissue mobilization, Manipulation and Manual lymphatic drainage was utilized and necessary because of the patient's restricted joint motion, painful spasm, loss of articular motion and restricted motion of soft tissue.     MODALITIES: ( minutes):         Therapy in order to provide analgesia, relieve muscle spasm and reduce inflammation and edema.             Date:  05/09/23 Date:  05/18/23   Date:     Activity/Exercise Parameters Parameters Parameters   Pt ed on biomechanics, PT POC, HEP, outcomes, prognosis, R shldr differential dx labral involvement v RTC tears v biceps tears v OA/DJD of the RUE shldr complex, BUE strengthening education, midback posture strengthening, red flag s/s of ruptured  soft tissue/muscle tissue/ligamentous clinical s.s education   23 mins     UBE bike   5 min CW/5 min CCW    Prone scapular retraction cues for lower trap activation, scapular depression/downward elevation/retraction/ cervical extension isometric holds throughout AROM    X 30       Prone horizontal ER + scapular retraction, cues for lower trap activation, scapular depression/downward elevation/retraction/ cervical extension isometric holds throughout AROM   X 30     Prone T's cues for scapular depression/downward elevation/retraction/isometric/cervical extension holds throughout AROM    X 20 rhomboid       X 20 middle trap     Rhythmic stabilization with emphasis on RC, serratus punches, cues for scapular  retraction, downward rotation, BUE upper trap inhibition    Supine perturbation's, cues for scapular retraction/downward rotation holds throughout motion, cues for BUE upper trap inhibition   X 30 #3             10 x 10 seconds A-P #3     10 x 10 seconds M-L #3                 Treatment/Session Summary:    Treatment Assessment:   Pt does present with dynamic instability of the RUE shldr. Pt today presented with diffuse weakness grossly with RTC strength and stability.  Will progress prn    Communication/Consultation:  None today  Equipment provided today:  None  Recommendations/Intent for next treatment session: Next visit will focus on BUE strengthening, BUE ROM and midback posture strengthening.    >Total Treatment Billable Duration:  38 minutes   Time In: 1541  Time Out: 1623    Marcial Pacas, PT         Charge Capture  Events  MedBridge Portal  Appt Desk  Attendance Report     Future Appointments   Date Time Provider Department Center   05/23/2023  1:30 PM Marcial Pacas, PT [facility]   05/30/2023  1:30 PM Marcial Pacas, PT [facility]   06/06/2023  1:30 PM Marcial Pacas, PT [facility]   06/13/2023 11:00 AM Milinda Antis, MD [facility]

## 2023-05-23 ENCOUNTER — Inpatient Hospital Stay: Admit: 2023-05-23

## 2023-05-23 NOTE — Progress Notes (Signed)
[name]  DOB: 19-Aug-1986  Primary:  (Self-Pay)  Secondary:  [name]  [address]  [place] [address]  Phone: [phone]  Fax: [phone] Plan Frequency: 2x a week for up to 90 days    Plan of Care/Certification Expiration Date: 08/07/23        Plan of Care/Certification Expiration Date:  Plan of Care/Certification Expiration Date: 08/07/23    Frequency/Duration: Plan Frequency: 2x a week for up to 90 days      Time In/Out:   Time In: 1332  Time Out: 1411      PT Visit Info:    Total # of Visits to Date: 3  Progress Note Counter: 3      Visit Count:  3    OUTPATIENT PHYSICAL THERAPY:   Treatment Note 05/23/2023       Episode  (RUE Shldr Pain)               Treatment Diagnosis:    Right shoulder pain, unspecified chronicity  Biceps tendonosis of right shoulder  Medical/Referring Diagnosis:    Biceps tendonosis of right shoulder  Shoulder injury, initial encounter    Referring Physician:  Milinda Antis, MD  MD Orders:  PT Eval and Treat   Return MD Appt:     Date of Onset:  Onset Date: 12/26/22     Allergies:   Patient has no known allergies.  Restrictions/Precautions:   None      Interventions Planned (Treatment may consist of any combination of the following):     See Assessment Note    Subjective Comments:   Pt reports slight improvement in pain in the RUE shldr     Initial Pain Level::3/10  Post Session Pain Level:  2/10  Medications Last Reviewed:  05/23/2023  Updated Objective Findings:  None Today  Treatment   THERAPEUTIC EXERCISE: (15 minutes):    Exercises per grid below to improve mobility, strength, balance and coordination.  Required visual, verbal, manual and tactile cues to promote proper body alignment, promote proper body posture, promote proper body mechanics and promote proper body breathing techniques.  Progressed resistance, range, repetitions and complexity of movement as indicated.    THERAPEUTIC ACTIVITY: (  minutes):    Therapeutic  activities per grid below to improve mobility, strength, balance and coordination.Required visual, verbal, manual and tactile cues to promote motor control of bilateral, lower extremity(s).    NEUROMUSCULAR RE-EDUCATION: (23 minutes):    Exercise/activities per grid below to improve balance, coordination, kinesthetic sense, posture and proprioception.  Required visual, verbal, manual and tactile cues to promote motor control of bilateral, lower extremity(s).    MANUAL THERAPY: ( minutes):   Joint mobilization, Soft tissue mobilization, Manipulation and Manual lymphatic drainage was utilized and necessary because of the patient's restricted joint motion, painful spasm, loss of articular motion and restricted motion of soft tissue.     MODALITIES: ( minutes):         Therapy in order to provide analgesia, relieve muscle spasm and reduce inflammation and edema.             Date:  05/09/23 Date:  05/18/23   Date:  05/23/23     Activity/Exercise Parameters Parameters Parameters   Pt ed on biomechanics, PT POC, HEP, outcomes, prognosis, R shldr differential dx labral involvement v RTC tears v biceps tears v OA/DJD of the RUE shldr complex, BUE strengthening education, midback posture strengthening, red flag s/s of  ruptured soft tissue/muscle tissue/ligamentous clinical s.s education   23 mins     UBE bike   5 min CW/5 min CCW 5 min CW/5 min CCW   Prone scapular retraction cues for lower trap activation, scapular depression/downward elevation/retraction/ cervical extension isometric holds throughout AROM    X 30  X 40      Prone horizontal ER + scapular retraction, cues for lower trap activation, scapular depression/downward elevation/retraction/ cervical extension isometric holds throughout AROM   X 30  X 40    Prone T's cues for scapular depression/downward elevation/retraction/isometric/cervical extension holds throughout AROM    X 20 rhomboid       X 20 middle trap  X 20 rhomboid       X 20 middle trap    Rhythmic  stabilization with emphasis on RC, serratus punches, cues for scapular retraction, downward rotation, BUE upper trap inhibition    Supine perturbation's, cues for scapular retraction/downward rotation holds throughout motion, cues for BUE upper trap inhibition   X 30 #3             10 x 10 seconds A-P #3     10 x 10 seconds M-L #3    X 30 #3             10 x 10 seconds A-P #3     10 x 10 seconds M-L #3              Treatment/Session Summary:    Treatment Assessment:   Pt does present with dynamic instability of the RUE shldr. Pt today presented with diffuse weakness grossly with RTC strength and stability.  Pt reports more some irritation with AROM. Pt does present with diffuse instability. Will progress prn    Communication/Consultation:  None today  Equipment provided today:  None  Recommendations/Intent for next treatment session: Next visit will focus on BUE strengthening, BUE ROM and midback posture strengthening.    >Total Treatment Billable Duration:  38 minutes   Time In: 1332  Time Out: 1411    Marcial Pacas, PT         Charge Capture  Events  MedBridge Portal  Appt Desk  Attendance Report     Future Appointments   Date Time Provider Department Center   05/30/2023  1:30 PM Marcial Pacas, [place] [facility]   06/06/2023  1:30 PM Marcial Pacas, PT [facility]   06/13/2023 11:00 AM Milinda Antis, MD [facility]

## 2023-05-30 ENCOUNTER — Inpatient Hospital Stay: Admit: 2023-05-30

## 2023-05-30 NOTE — Progress Notes (Signed)
Adam Snyder  DOB: 1986-10-01  Primary:  (Self-Pay)  Secondary:  Adam Snyder  [address]  [place] [address]  Phone: [phone]  Fax: [phone] Plan Frequency: 2x a week for up to 90 days    Plan of Care/Certification Expiration Date: 08/07/23        Plan of Care/Certification Expiration Date:  Plan of Care/Certification Expiration Date: 08/07/23    Frequency/Duration: Plan Frequency: 2x a week for up to 90 days      Time In/Out:   Time In: 1339  Time Out: 1418      PT Visit Info:    Total # of Visits to Date: 4  Progress Note Counter: 4      Visit Count:  4    OUTPATIENT PHYSICAL THERAPY:   Treatment Note 05/30/2023       Episode  (RUE Shldr Pain)               Treatment Diagnosis:    Right shoulder pain, unspecified chronicity  Biceps tendonosis of right shoulder  Medical/Referring Diagnosis:    Biceps tendonosis of right shoulder  Shoulder injury, initial encounter    Referring Physician:  Milinda Antis, MD  MD Orders:  PT Eval and Treat   Return MD Appt:     Date of Onset:  Onset Date: 12/26/22     Allergies:   Patient has no known allergies.  Restrictions/Precautions:   None      Interventions Planned (Treatment may consist of any combination of the following):     See Assessment Note    Subjective Comments:   Pt reports slight improvement in pain in the RUE shldr     Initial Pain Level::3/10  Post Session Pain Level:  2/10  Medications Last Reviewed:  05/30/2023  Updated Objective Findings:  None Today  Treatment   THERAPEUTIC EXERCISE: (15 minutes):    Exercises per grid below to improve mobility, strength, balance and coordination.  Required visual, verbal, manual and tactile cues to promote proper body alignment, promote proper body posture, promote proper body mechanics and promote proper body breathing techniques.  Progressed resistance, range, repetitions and complexity of movement as indicated.    THERAPEUTIC ACTIVITY: (  minutes):    Therapeutic  activities per grid below to improve mobility, strength, balance and coordination.Required visual, verbal, manual and tactile cues to promote motor control of bilateral, lower extremity(s).    NEUROMUSCULAR RE-EDUCATION: (23 minutes):    Exercise/activities per grid below to improve balance, coordination, kinesthetic sense, posture and proprioception.  Required visual, verbal, manual and tactile cues to promote motor control of bilateral, lower extremity(s).    MANUAL THERAPY: ( minutes):   Joint mobilization, Soft tissue mobilization, Manipulation and Manual lymphatic drainage was utilized and necessary because of the patient's restricted joint motion, painful spasm, loss of articular motion and restricted motion of soft tissue.     MODALITIES: ( minutes):         Therapy in order to provide analgesia, relieve muscle spasm and reduce inflammation and edema.             Date:  05/09/23 Date:  05/18/23   Date:  05/23/23   Date:05/30/23     Activity/Exercise Parameters Parameters Parameters    Pt ed on biomechanics, PT POC, HEP, outcomes, prognosis, R shldr differential dx labral involvement v RTC tears v biceps tears v OA/DJD of the RUE shldr complex, BUE strengthening education, midback posture strengthening,  red flag s/s of ruptured soft tissue/muscle tissue/ligamentous clinical s.s education   23 mins      UBE bike   5 min CW/5 min CCW 5 min CW/5 min CCW 5 min CW/5 min CCW   Prone scapular retraction cues for lower trap activation, scapular depression/downward elevation/retraction/ cervical extension isometric holds throughout AROM    X 30  X 40  X 40      Prone horizontal ER + scapular retraction, cues for lower trap activation, scapular depression/downward elevation/retraction/ cervical extension isometric holds throughout AROM   X 30  X 40  X 40    Prone T's cues for scapular depression/downward elevation/retraction/isometric/cervical extension holds throughout AROM    X 20 rhomboid       X 20 middle trap  X 20  rhomboid       X 20 middle trap  X 20 rhomboid       X 20 middle trap    Rhythmic stabilization with emphasis on RC, serratus punches, cues for scapular retraction, downward rotation, BUE upper trap inhibition    Supine perturbation's, cues for scapular retraction/downward rotation holds throughout motion, cues for BUE upper trap inhibition   X 30 #3             10 x 10 seconds A-P #3     10 x 10 seconds M-L #3    X 30 #3             10 x 10 seconds A-P #3     10 x 10 seconds M-L #3  X 30 #3             10 x 10 seconds A-P #3     10 x 10 seconds M-L #3               Treatment/Session Summary:    Treatment Assessment:   Pt does present with dynamic instability of the RUE shldr. Pt today presented with diffuse weakness grossly with RTC strength and stability.  Pt reports more some irritation with AROM. Pt does present with diffuse instability. Pt reports some anterior RUE shldr pain especially with forward flexion movements. Will progress prn    Communication/Consultation:  None today  Equipment provided today:  None  Recommendations/Intent for next treatment session: Next visit will focus on BUE strengthening, BUE ROM and midback posture strengthening.    >Total Treatment Billable Duration:  38 minutes   Time In: 1339  Time Out: 1418    Marcial Pacas, PT         Charge Capture  Events  MedBridge Portal  Appt Desk  Attendance Report     Future Appointments   Date Time Provider Department Center   06/06/2023  1:30 PM Marcial Pacas, [place] [facility]   06/13/2023 11:00 AM Milinda Antis, MD [facility]

## 2023-06-06 ENCOUNTER — Inpatient Hospital Stay
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
## 2023-06-13 ENCOUNTER — Encounter: Attending: Emergency Medicine

## 2023-06-13 DIAGNOSIS — M67813 Other specified disorders of tendon, right shoulder: Secondary | ICD-10-CM
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
## 2023-06-14 ENCOUNTER — Ambulatory Visit: Admit: 2023-06-14 | Discharge: 2023-06-14 | Attending: Emergency Medicine

## 2023-06-14 DIAGNOSIS — S4990XD Unspecified injury of shoulder and upper arm, unspecified arm, subsequent encounter: Secondary | ICD-10-CM
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
## 2023-06-21 ENCOUNTER — Ambulatory Visit: Admit: 2023-06-21 | Discharge: 2023-06-21

## 2023-06-21 DIAGNOSIS — S4990XD Unspecified injury of shoulder and upper arm, unspecified arm, subsequent encounter: Secondary | ICD-10-CM

## 2023-06-28 ENCOUNTER — Encounter: Attending: Emergency Medicine

## 2023-06-28 NOTE — Progress Notes (Deleted)
 Name: Adam Snyder  Date of Birth: 1986-06-11  Gender: male  MRN: [id number]      CC: No chief complaint on file.       HPI: Adam Snyder is a 37 y.o. male who returns for follow up and MRI results of the ***.     Recall hx: ***    Physical Examination:  General: no acute distress, well appearing  Lungs: no respiratory distress or stridor   CV: regular rhythm by pulse, normal capillary refill    ***    Imaging:     {LCXRreview:50144}    Independent review findings:     Rotator cuff without discrete tear. Supraspinatus tendinosis no discrete tear. Infraspinatus intact. Subscapularis intact. Teres minor intact. Biceps tendon intact. There is a SLAP tear, type 2. There are small cystic changes to greater tuberosity posteriorly.     Radiology Report:   FINDINGS:  Alignment is normal.  Normal subacromial subdeltoid bursa.  No glenohumeral joint effusion or biceps tenosynovitis.  ACROMIAL ARCH: The acromion is flat with lateral downsloping..  No subacromial  spur.  Normal acromioclavicular joint.  ROTATOR CUFF:  The supraspinatus, infraspinatus, and subscapularis tendons are  intact.  Mild posterior infraspinatus and mild subscapularis tendinosis.  LONG HEAD OF THE BICEPS TENDON:  Horizontal and vertical portions are intact.  No biceps tenosynovitis.  GLENOHUMERAL JOINT: Slightly suboptimal labral evaluation due to patient motion  artifact and lack of joint distention.  Chronic labral degeneration, with a  possible small chronic nondisplaced posterior labral tear.  Humeral head and  glenoid cartilage intact.  Intact glenohumeral ligaments.  No fracture or osteonecrosis.  Marrow signal is benign.  Normal rotator cuff  muscle bulk and signal.  Mild chronic subcortical cystic change at the posterior  greater tuberosity.  Visualized nerves and vessels are normal.  Partially imaged  is possible fluid in the lateral aspect of the upper anterior right chest wall,  limited evaluation due to incomplete inclusion and only  partially seen on the  axial and sagittal sequences.  Impression  1.  Mild rotator cuff tendinopathy without tear.  2.  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  3.  No evidence of fracture, joint effusion, or muscle tear in the right  shoulder.  4.  Partially imaged apparent fluid in the anterior right chest wall deep to the  pectoral muscle, limited evaluation due to incomplete inclusion.   Chest wall  muscle tear not excluded.  Consider MRI of the chest for further evaluation  depending on clinical findings.    Assessment:   No diagnosis found.    Plan:   ***    No orders of the defined types were placed in this encounter.       No follow-ups on file.     [name], MD   Orthopaedics and Sports Medicine  [facility]

## 2023-06-28 NOTE — Telephone Encounter (Signed)
 Pt had to  cx  today  and he works 3rd shift and can come better at a  2 pm  time    Can you please work him back in at [employer]  at a 2 pm time  this is  for  MRI  results

## 2023-07-19 ENCOUNTER — Ambulatory Visit: Admit: 2023-07-19 | Discharge: 2023-07-19 | Attending: Emergency Medicine

## 2023-07-19 DIAGNOSIS — S43431D Superior glenoid labrum lesion of right shoulder, subsequent encounter: Secondary | ICD-10-CM
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
## 2023-07-25 NOTE — Telephone Encounter (Signed)
 Patient says he is in a lot ff pain and would like to talk to someone. Pt knows he is scheduled for a inj tomorrow, he would still like to talk to someone today.

## 2023-07-25 NOTE — Telephone Encounter (Signed)
 Returned patient's call - he expressed how much his pain has increased over the past week. He would still like to come in for his appointment and potential injection tomorrow, 9/25. He has requested a refill of Diclofenac in the meantime.

## 2023-07-26 ENCOUNTER — Ambulatory Visit: Admit: 2023-07-26 | Discharge: 2023-07-26

## 2023-07-26 ENCOUNTER — Ambulatory Visit: Admit: 2023-07-26 | Discharge: 2023-07-26 | Attending: Emergency Medicine

## 2023-07-26 DIAGNOSIS — S43431D Superior glenoid labrum lesion of right shoulder, subsequent encounter: Secondary | ICD-10-CM

## 2023-07-26 MED ORDER — METHYLPREDNISOLONE ACETATE 40 MG/ML IJ SUSP
40 | Freq: Once | INTRAMUSCULAR | Status: AC
Start: 2023-07-26 — End: 2023-07-26

## 2023-07-26 MED ORDER — DICLOFENAC SODIUM 50 MG PO TBEC
50 MG | ORAL_TABLET | Freq: Two times a day (BID) | ORAL | 1 refills | Status: DC | PRN
Start: 2023-07-26 — End: 2024-03-27

## 2023-07-26 MED ADMIN — methylPREDNISolone acetate (DEPO-MEDROL) injection 40 mg: 40 mg | INTRA_ARTICULAR | @ 15:00:00 | NDC 00009028003
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
## 2023-08-10 ENCOUNTER — Encounter: Attending: Rehabilitative and Restorative Service Providers"

## 2023-08-10 NOTE — Progress Notes (Deleted)
R Shoulder  Frequency: 2-3 times a week for 6 weeks.  Treatment and Precautions: Evaluate and Treat

## 2023-08-14 NOTE — ED Provider Notes (Signed)
Formatting of this note is different from the original.    [facility]    ED Provider Note    Medical screening exam initiated in triage. HPI reviewed. Vital signs reviewed. No signs of distress in triage. Appropriate orders, if needed, have been initiated based on my screening examination.     Needs medical clearance form completed so he can donate plasma. No complaints    Burnard Leigh, FNP-C  4:21 PM     Adam Snyder 37 y.o. male DOB: Nov 19, 1985 MRN: [id number]  History     Chief Complaint   Patient presents with    Letter for School/Work     Patient needs clearance to give plasma again, has no PCP so came here. Denies any medical problems.      This patient is here because he needs medical screening to give plasma.  Apparently had an episode of tachycardia previously and did not seek medical care.    History provided by:  Patient  Language interpreter used: No    Tachycardia  Palpitations quality:  Fast  Onset quality:  Sudden  Progression:  Unchanged  Chronicity: Resolved.  Relieved by:  Nothing  Worsened by:  Nothing tried  Associated symptoms: no chest pain, no dizziness and no shortness of breath      No past medical history on file.    No past surgical history on file.    Social History     Substance and Sexual Activity   Alcohol Use Not Currently     Social History     Tobacco Use   Smoking Status Never   Smokeless Tobacco Never     E-Cigarettes    Vaping Use      Start Date      Cartridges/Day      Quit Date       Social History     Substance and Sexual Activity   Drug Use Not Currently           No Known Allergies    Discharge Medication List as of 08/14/2023  4:53 PM       CONTINUE these medications which have NOT CHANGED    Details   diphenhydrAMINE (BANOPHEN) 25 mg tablet Take two tablets (50 mg dose) by mouth every 6 (six) hours for 5 days., Starting Fri 02/19/2021, Until Wed 02/24/2021, Print     naproxen (NAPROSYN) 500 mg tablet Take one tablet (500 mg dose) by mouth 2  (two) times daily., Starting Mon 03/08/2021, Print     predniSONE (DELTASONE) 20 mg tablet Take 3 tabs days 1 and 2, then 2 tabs days 3 and 4, then 1 tab days 5 and 6, Print         Review of Systems     Review of Systems   Constitutional:  Negative for chills and fatigue.   HENT:  Negative for drooling.    Eyes:  Negative for pain.   Respiratory:  Negative for choking and shortness of breath.    Cardiovascular:  Negative for chest pain.   Gastrointestinal:  Negative for abdominal pain.   Genitourinary:  Negative for dysuria and flank pain.   Musculoskeletal:  Negative for arthralgias.   Neurological:  Negative for dizziness and weakness.   Hematological:  Negative for adenopathy.   Psychiatric/Behavioral:  Negative for agitation.      Physical Exam     ED Triage Vitals [08/14/23 1622]   BP 120/83   Heart Rate 87  Resp 17   SpO2 99 %   Temp 98.8 F (37.1 C)     Physical Exam   Nursing note and vitals reviewed.  Constitutional: He appears well-developed and well-nourished.   HENT:   Head: Normocephalic and atraumatic.   Eyes: Pupils are equal, round, and reactive to light.   Neck: Normal range of motion. Neck supple.   Cardiovascular: Normal rate, regular rhythm and normal heart sounds.   Pulmonary/Chest: Respiratory effort normal. No visible chest trauma.   Abdominal: Soft. Bowel sounds are normal.   Musculoskeletal: Normal range of motion.      Cervical back: Normal range of motion and neck supple. no edema.    Neurological: He is alert. Moves all extremities equally.   Skin: Skin is warm. Skin is dry.   Psychiatric: He has a normal mood and affect.     ED Course     Lab results:  No data to display    Imaging:  No data to display    ECG:  ECG Results    None                                        Pre-Sedation  Procedures      Medical Decision Making  Differential diagnosis close PSVT, sinus tach, atrial fibrillation  Patient has been asymptomatic.  He is in a normal sinus rhythm right now.  He is approved to give  plasma.    Amount and/or Complexity of Data Reviewed  Discussion of management or test interpretation with external provider(s): Patient is stable for discharge.    Provider Communication    Discharge Medication List as of 08/14/2023  4:53 PM       Discharge Medication List as of 08/14/2023  4:53 PM       Discharge Medication List as of 08/14/2023  4:53 PM       Clinical Impression  Final diagnoses:   Encounter for medical screening examination     ED Disposition       ED Disposition   Discharge    Condition   Stable    Comment   --                    Follow-up Information       [facility] Emergency Department.    Specialty: Emergency Medicine  Comments: As needed  Contact information:  [address]  [place] [address]  [phone]                    Electronically signed by:      Cecille Po, MD  08/14/23 2153    Electronically signed by Cecille Po, MD at 08/14/2023  9:53 PM EDT

## 2023-08-14 NOTE — ED Notes (Signed)
Formatting of this note might be different from the original.  Pt states he just needs medical consent form and clearance in order to donate plasma, was told to come to ED by clinic to get formed signed.   Electronically signed by Reuben Likes, RN at 08/14/2023  4:44 PM EDT

## 2023-08-24 ENCOUNTER — Encounter: Admit: 2023-08-24 | Discharge: 2023-08-24 | Attending: Rehabilitative and Restorative Service Providers"

## 2023-08-24 DIAGNOSIS — M25511 Pain in right shoulder: Secondary | ICD-10-CM
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
## 2023-08-29 NOTE — Telephone Encounter (Signed)
Can you close this encounter?

## 2023-09-01 ENCOUNTER — Encounter: Attending: Rehabilitative and Restorative Service Providers"
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
## 2023-09-04 ENCOUNTER — Encounter: Attending: Rehabilitative and Restorative Service Providers"

## 2023-09-09 ENCOUNTER — Inpatient Hospital Stay
Admit: 2023-09-09 | Discharge: 2023-09-10 | Disposition: A | Discharge status: Home / Self Care | Payer: PRIVATE HEALTH INSURANCE | Attending: Emergency Medicine

## 2023-09-09 DIAGNOSIS — K529 Noninfective gastroenteritis and colitis, unspecified: Secondary | ICD-10-CM

## 2023-09-09 LAB — CBC WITH AUTO DIFFERENTIAL
Basophils %: 1 % (ref 0.0–2.0)
Basophils Absolute: 0 10*3/uL (ref 0.0–0.2)
Eosinophils %: 0 % — ABNORMAL LOW (ref 0.5–7.8)
Eosinophils Absolute: 0 10*3/uL (ref 0.0–0.8)
Hematocrit: 45.5 % (ref 41.1–50.3)
Hemoglobin: 14.9 g/dL (ref 13.6–17.2)
Immature Granulocytes %: 0 % (ref 0.0–5.0)
Immature Granulocytes Absolute: 0 10*3/uL (ref 0.0–0.5)
Lymphocytes %: 40 % (ref 13–44)
Lymphocytes Absolute: 1.4 10*3/uL (ref 0.5–4.6)
MCH: 30.2 pg (ref 26.1–32.9)
MCHC: 32.7 g/dL (ref 31.4–35.0)
MCV: 92.1 fL (ref 82–102)
MPV: 9.9 fL (ref 9.4–12.3)
Monocytes %: 13 % — ABNORMAL HIGH (ref 4.0–12.0)
Monocytes Absolute: 0.5 10*3/uL (ref 0.1–1.3)
Neutrophils %: 46 % (ref 43–78)
Neutrophils Absolute: 1.7 10*3/uL (ref 1.7–8.2)
Platelets: 210 10*3/uL (ref 150–450)
RBC: 4.94 M/uL (ref 4.23–5.6)
RDW: 12.2 % (ref 11.9–14.6)
WBC: 3.6 10*3/uL — ABNORMAL LOW (ref 4.3–11.1)
nRBC: 0 10*3/uL (ref 0.0–0.2)

## 2023-09-09 MED ORDER — SODIUM CHLORIDE (PF) 0.9 % IJ SOLN
0.9 | Freq: Every day | INTRAMUSCULAR | Status: DC
Start: 2023-09-09 — End: 2023-09-10
  Administered 2023-09-09: 40 mg via INTRAVENOUS

## 2023-09-09 MED ORDER — ONDANSETRON HCL 4 MG/2ML IJ SOLN
4 | INTRAMUSCULAR | Status: AC
Start: 2023-09-09 — End: 2023-09-09
  Administered 2023-09-09: 8 mg via INTRAVENOUS

## 2023-09-09 MED FILL — PANTOPRAZOLE SODIUM 40 MG IV SOLR: 40 MG | INTRAVENOUS | Qty: 40

## 2023-09-09 MED FILL — ONDANSETRON HCL 4 MG/2ML IJ SOLN: 4 MG/2ML | INTRAMUSCULAR | Qty: 4

## 2023-09-09 NOTE — Discharge Instructions (Signed)
Stay hydrated.

## 2023-09-09 NOTE — ED Provider Notes (Signed)
Emergency Department Provider Note       PCP: No primary care provider on file.   Age: 37 y.o.   Sex: male     DISPOSITION Decision To Discharge 09/09/2023 09:11:02 PM    ICD-10-CM    1. Gastroenteritis  K52.9           Medical Decision Making     Patient with a gastroenteritis after eating at [place] [employer].  Doing better with medication.  Will discharge     1 or more acute illnesses that pose a threat to life or bodily function.   Prescription drug management performed.  Patient was discharged risks and benefits of hospitalization were considered.  Shared medical decision making was utilized in creating the patients health plan today.  I independently ordered and reviewed each unique test.                         History     Patient had a cold [place] on Thursday.  Ever since then has had nausea and diarrhea.  Started taking Pepto-Bismol and his stool is turned darker in color since then.  Has some upper abdominal aching and ruffling.  Here for evaluation.  No dysuria.    The history is provided by the patient. No language interpreter was used.   Diarrhea  Quality:  Watery  Severity:  Moderate  Duration:  3 days  Timing:  Constant  Progression:  Improving  Relieved by:  Nothing  Worsened by:  Nothing  Associated symptoms: abdominal pain and chills    Associated symptoms: no recent cough, no fever, no headaches, no myalgias and no vomiting        ROS     Review of Systems   Constitutional:  Positive for chills and fatigue. Negative for fever.   HENT:  Negative for rhinorrhea and sore throat.    Respiratory:  Negative for cough and shortness of breath.    Cardiovascular:  Negative for chest pain and palpitations.   Gastrointestinal:  Positive for abdominal pain and diarrhea. Negative for constipation, nausea and vomiting.   Genitourinary:  Negative for dysuria and hematuria.   Musculoskeletal:  Negative for back pain, myalgias and neck pain.   Skin:  Negative for color change and rash.   Neurological:  Negative for  numbness and headaches.   All other systems reviewed and are negative.       Physical Exam     Vitals signs and nursing note reviewed:  Vitals:    09/09/23 1843 09/09/23 1844 09/09/23 2015   BP: (!) 134/90  125/89   Pulse: 97     Resp: 17     Temp: 98.8 F (37.1 C)     SpO2: 97%  98%   Weight:  81.6 kg (180 lb)    Height:  1.905 m (6\' 3" )       Physical Exam  Vitals and nursing note reviewed.   Constitutional:       Appearance: Normal appearance.   HENT:      Head: Normocephalic and atraumatic.   Cardiovascular:      Rate and Rhythm: Normal rate and regular rhythm.   Pulmonary:      Effort: Pulmonary effort is normal.      Breath sounds: Normal breath sounds. No wheezing.   Abdominal:      General: Bowel sounds are normal.      Palpations: Abdomen is soft.      Tenderness:  There is abdominal tenderness (mild upper abd). There is no guarding or rebound.   Musculoskeletal:         General: No swelling. Normal range of motion.      Cervical back: Normal range of motion. No tenderness.   Skin:     General: Skin is warm and dry.   Neurological:      Mental Status: He is alert.        Procedures     Procedures    Orders placed during this emergency department visit:     Orders Placed This Encounter   Procedures    COVID-19, Rapid    CBC with Auto Differential    Comprehensive Metabolic Panel    Lipase    Urinalysis with Reflex to Culture    Bring at least 16 ounces of patient preferred non-carbonated beverage to bedside. Direct patient to take sips 15 minutes after administration of antiemetic. If sips tolerated after 15 minutes, direct patient to consume 8 ounces over the next 30 mi...        Medications given during this emergency department visit:     Medications   pantoprazole (PROTONIX) 40 mg in sodium chloride (PF) 0.9 % 10 mL injection (40 mg IntraVENous Given 09/09/23 1853)   ondansetron (ZOFRAN) injection 8 mg (8 mg IntraVENous Given 09/09/23 1853)       New prescriptions:     New Prescriptions    ONDANSETRON  (ZOFRAN-ODT) 4 MG DISINTEGRATING TABLET    Take 1 tablet by mouth 3 times daily as needed for Nausea or Vomiting    PANTOPRAZOLE (PROTONIX) 20 MG TABLET    Take 1 tablet by mouth every morning (before breakfast)        Past History and Complexity:     No past medical history on file.     No past surgical history on file.     Social History     Socioeconomic History    Marital status: Single   Tobacco Use    Smoking status: Never    Smokeless tobacco: Never   Substance and Sexual Activity    Alcohol use: Yes    Drug use: Never     Social Determinants of Health      Received from [facility], [facility]    Social Network   Intimate Partner Violence: Not At Risk (08/14/2023)    Received from [facility]    HITS     Over the last 12 months how often did your partner physically hurt you?: 1     Over the last 12 months how often did your partner insult you or talk down to you?: 1     Over the last 12 months how often did your partner threaten you with physical harm?: 1     Over the last 12 months how often did your partner scream or curse at you?: 1        Previous Medications    DICLOFENAC (VOLTAREN) 50 MG EC TABLET    Take 1 tablet by mouth 2 times daily as needed for Pain    MELOXICAM (MOBIC) 15 MG TABLET    Take 1 tablet by mouth daily for 14 days        Results from this emergency department visit:      Results for orders placed or performed during the hospital encounter of 09/09/23   COVID-19, Rapid    Specimen: Nasopharyngeal   Result Value Ref Range    Source  NASAL      SARS-CoV-2, Rapid Not detected NOTD     CBC with Auto Differential   Result Value Ref Range    WBC 3.6 (L) 4.3 - 11.1 K/uL    RBC 4.94 4.23 - 5.6 M/uL    Hemoglobin 14.9 13.6 - 17.2 g/dL    Hematocrit 06.3 01.6 - 50.3 %    MCV 92.1 82 - 102 FL    MCH 30.2 26.1 - 32.9 PG    MCHC 32.7 31.4 - 35.0 g/dL    RDW 01.0 93.2 - 35.5 %    Platelets 210 150 - 450 K/uL    MPV 9.9 9.4 - 12.3 FL    nRBC 0.00 0.0 - 0.2 K/uL    Differential Type AUTOMATED       Neutrophils % 46 43 - 78 %    Lymphocytes % 40 13 - 44 %    Monocytes % 13 (H) 4.0 - 12.0 %    Eosinophils % 0 (L) 0.5 - 7.8 %    Basophils % 1 0.0 - 2.0 %    Immature Granulocytes % 0 0.0 - 5.0 %    Neutrophils Absolute 1.7 1.7 - 8.2 K/UL    Lymphocytes Absolute 1.4 0.5 - 4.6 K/UL    Monocytes Absolute 0.5 0.1 - 1.3 K/UL    Eosinophils Absolute 0.0 0.0 - 0.8 K/UL    Basophils Absolute 0.0 0.0 - 0.2 K/UL    Immature Granulocytes Absolute 0.0 0.0 - 0.5 K/UL   Comprehensive Metabolic Panel   Result Value Ref Range    Sodium 138 136 - 145 mmol/L    Potassium 3.8 3.5 - 5.1 mmol/L    Chloride 102 98 - 107 mmol/L    CO2 29 20 - 29 mmol/L    Anion Gap 7 7 - 16 mmol/L    Glucose 99 70 - 99 mg/dL    BUN 6 6 - 23 MG/DL    Creatinine 7.32 2.02 - 1.30 MG/DL    Est, Glom Filt Rate 87 >60 ml/min/1.3m2    Calcium 8.7 (L) 8.8 - 10.2 MG/DL    Total Bilirubin 0.3 0.0 - 1.2 MG/DL    ALT 18 8 - 55 U/L    AST 33 15 - 37 U/L    Alk Phosphatase 50 40 - 129 U/L    Total Protein 6.9 6.3 - 8.2 g/dL    Albumin 3.4 (L) 3.5 - 5.0 g/dL    Globulin 3.4 2.3 - 3.5 g/dL    Albumin/Globulin Ratio 1.0 1.0 - 1.9     Lipase   Result Value Ref Range    Lipase 24 13 - 60 U/L   Urinalysis with Reflex to Culture    Specimen: Urine   Result Value Ref Range    Color, UA YELLOW/STRAW      Appearance CLEAR      Specific Gravity, UA 1.011 1.001 - 1.023      pH, Urine 6.5 5.0 - 9.0      Protein, UA Negative NEG mg/dL    Glucose, Ur Negative NEG mg/dL    Ketones, Urine Negative NEG mg/dL    Bilirubin, Urine Negative NEG      Blood, Urine Negative NEG      Urobilinogen, Urine 1.0 0.2 - 1.0 EU/dL    Nitrite, Urine Negative NEG      Leukocyte Esterase, Urine Negative NEG      Urine Culture if Indicated CULTURE NOT INDICATED BY UA RESULT  WBC, UA 0-4 U4 /hpf    RBC, UA 0-5 U5 /hpf    BACTERIA, URINE Negative NEG /hpf    Epithelial Cells, UA 0-5 U5 /hpf    Hyaline Casts, UA 0-2 /lpf         No orders to display                Recent Labs     09/09/23  1850    COVID19 Not detected        Voice dictation software was used during the making of this note.  This software is not perfect and grammatical and other typographical errors may be present.  This note has not been completely proofread for errors.     Gwenette Greet, MD  09/09/23 2114

## 2023-09-09 NOTE — ED Notes (Signed)
Patient mobility status  with no difficulty.     I have reviewed discharge instructions with the patient.  The patient verbalized understanding.    Patient left ED via Discharge Method: ambulatory to Home.    Opportunity for questions and clarification provided.     Patient given 2 scripts.           Annamarie Dawley, RN  09/09/23 2241

## 2023-09-09 NOTE — ED Triage Notes (Signed)
Patient arrived with a complaint of food poisoning. Patient reports of diarrhea after eating on Thursday. Tried OTC and drinking electrolyte but not improving. Patient reports of color of lose stool is black. Denies dizziness, not vomiting. Patient reports of nausea and headache.

## 2023-09-10 LAB — COMPREHENSIVE METABOLIC PANEL
ALT: 18 U/L (ref 8–55)
AST: 33 U/L (ref 15–37)
Albumin/Globulin Ratio: 1 (ref 1.0–1.9)
Albumin: 3.4 g/dL — ABNORMAL LOW (ref 3.5–5.0)
Alk Phosphatase: 50 U/L (ref 40–129)
Anion Gap: 7 mmol/L (ref 7–16)
BUN: 6 mg/dL (ref 6–23)
CO2: 29 mmol/L (ref 20–29)
Calcium: 8.7 mg/dL — ABNORMAL LOW (ref 8.8–10.2)
Chloride: 102 mmol/L (ref 98–107)
Creatinine: 1.13 mg/dL (ref 0.80–1.30)
Est, Glom Filt Rate: 87 mL/min/{1.73_m2} (ref >60)
Globulin: 3.4 g/dL (ref 2.3–3.5)
Glucose: 99 mg/dL (ref 70–99)
Potassium: 3.8 mmol/L (ref 3.5–5.1)
Sodium: 138 mmol/L (ref 136–145)
Total Bilirubin: 0.3 mg/dL (ref 0.0–1.2)
Total Protein: 6.9 g/dL (ref 6.3–8.2)

## 2023-09-10 LAB — URINALYSIS WITH REFLEX TO CULTURE
BACTERIA, URINE: NEGATIVE /[HPF]
Bilirubin, Urine: NEGATIVE
Blood, Urine: NEGATIVE
Glucose, Ur: NEGATIVE mg/dL
Ketones, Urine: NEGATIVE mg/dL
Leukocyte Esterase, Urine: NEGATIVE
Nitrite, Urine: NEGATIVE
Protein, UA: NEGATIVE mg/dL
Specific Gravity, UA: 1.011 (ref 1.001–1.023)
Urobilinogen, Urine: 1 U/dL (ref 0.2–1.0)
pH, Urine: 6.5 (ref 5.0–9.0)

## 2023-09-10 LAB — LIPASE: Lipase: 24 U/L (ref 13–60)

## 2023-09-10 LAB — COVID-19, RAPID: SARS-CoV-2, Rapid: NOT DETECTED

## 2023-09-10 MED ORDER — ONDANSETRON 4 MG PO TBDP
4 | ORAL_TABLET | Freq: Three times a day (TID) | ORAL | 0 refills | Status: AC | PRN
Start: 2023-09-10 — End: ?

## 2023-09-10 MED ORDER — PANTOPRAZOLE SODIUM 20 MG PO TBEC
20 | ORAL_TABLET | Freq: Every day | ORAL | 0 refills | Status: AC
Start: 2023-09-10 — End: ?

## 2023-09-12 ENCOUNTER — Encounter: Attending: Rehabilitative and Restorative Service Providers"

## 2023-09-12 NOTE — Progress Notes (Signed)
 [name]  [place] [address]  Dept: [phone]      Physical Therapy Daily Note     Referring MD: Milinda Antis, MD  Diagnosis:     ICD-10-CM    1. Right shoulder pain, unspecified chronicity  M25.511

## 2023-09-15 ENCOUNTER — Encounter: Admit: 2023-09-15 | Admitting: Rehabilitative and Restorative Service Providers"

## 2023-09-15 DIAGNOSIS — M25511 Pain in right shoulder: Principal | ICD-10-CM

## 2023-09-15 NOTE — Progress Notes (Signed)
 [name]  [place] [address]  Dept: [phone]      Physical Therapy Daily Note     Referring MD: Milinda Antis, MD  Diagnosis:     ICD-10-CM    1. Right shoulder pain, unspecified chronicity  M25.511

## 2023-09-20 ENCOUNTER — Ambulatory Visit
Admit: 2023-09-20 | Discharge: 2023-09-20 | Payer: PRIVATE HEALTH INSURANCE | Attending: Emergency Medicine | Admitting: Emergency Medicine

## 2023-09-20 DIAGNOSIS — S43431D Superior glenoid labrum lesion of right shoulder, subsequent encounter: Secondary | ICD-10-CM

## 2023-09-20 NOTE — ED Provider Notes (Addendum)
 History  No chief complaint on file.    Patient is a 37 year old male who reports history of anxiety who presents for evaluation after having episode of chest pain shortness of breath palpitations.  Patient states he is woken from sleep around 3:00 this mo

## 2023-09-26 ENCOUNTER — Encounter: Payer: PRIVATE HEALTH INSURANCE | Attending: Rehabilitative and Restorative Service Providers"

## 2023-09-26 NOTE — Progress Notes (Deleted)
 [name]  [place] [address]  Dept: [phone]      Physical Therapy Daily Note     Referring MD: Milinda Antis, MD  Diagnosis:   No diagnosis found.         Therapy precautions:None  Co-morbidities affecti

## 2023-09-27 ENCOUNTER — Encounter: Admit: 2023-09-27 | Payer: PRIVATE HEALTH INSURANCE | Admitting: Rehabilitative and Restorative Service Providers"

## 2023-09-27 DIAGNOSIS — M25511 Pain in right shoulder: Principal | ICD-10-CM

## 2023-09-27 NOTE — Telephone Encounter (Signed)
 This phone call took place on 09/26/23 following patient not showing for PT appt.    Confirmed appt for 09/27/23

## 2023-09-27 NOTE — Progress Notes (Signed)
 [facility] Kristine Royal  [place] [address]  Dept: [phone]      Physical Therapy Daily Note     Referring MD: Milinda Antis, MD  Diagnosis:     ICD-10-CM    1. Right shoulder pain, unspecified chronicity  M25.511

## 2023-10-05 ENCOUNTER — Encounter: Payer: PRIVATE HEALTH INSURANCE | Attending: Rehabilitative and Restorative Service Providers"

## 2023-10-05 NOTE — Progress Notes (Deleted)
 [name]  [place] [address]  Dept: [phone]      Physical Therapy Daily Note     Referring MD: Milinda Antis, MD  Diagnosis:     ICD-10-CM    1. Right shoulder pain, unspecified chronicity  M25.511

## 2023-10-05 NOTE — Telephone Encounter (Signed)
 NS.  Patient called. Spoke w patient who cites illness.   Patient rescheduled for next Tues 10/10/23

## 2023-10-10 ENCOUNTER — Encounter: Admit: 2023-10-10 | Payer: PRIVATE HEALTH INSURANCE | Admitting: Rehabilitative and Restorative Service Providers"

## 2023-10-10 DIAGNOSIS — M25511 Pain in right shoulder: Principal | ICD-10-CM

## 2023-10-10 NOTE — Progress Notes (Signed)
 [facility] Kristine Royal  [place] [address]  Dept: [phone]      Physical Therapy Daily Note     Referring MD: Milinda Antis, MD  Diagnosis:     ICD-10-CM    1. Right shoulder pain, unspecified chronicity  M25.511

## 2023-10-18 ENCOUNTER — Encounter
Admit: 2023-10-18 | Discharge: 2023-10-18 | Payer: PRIVATE HEALTH INSURANCE | Attending: Rehabilitative and Restorative Service Providers" | Admitting: Rehabilitative and Restorative Service Providers"

## 2023-10-18 DIAGNOSIS — M25511 Pain in right shoulder: Secondary | ICD-10-CM

## 2023-10-18 NOTE — Progress Notes (Signed)
 [facility] Kristine Royal  [place] [address]  Dept: [phone]      Physical Therapy Daily Note     Referring MD: Milinda Antis, MD  Diagnosis:     ICD-10-CM    1. Right shoulder pain, unspecified chronicity  M25.511

## 2023-10-20 ENCOUNTER — Encounter: Payer: PRIVATE HEALTH INSURANCE | Attending: Rehabilitative and Restorative Service Providers"

## 2023-10-24 ENCOUNTER — Encounter: Payer: PRIVATE HEALTH INSURANCE | Attending: Rehabilitative and Restorative Service Providers"

## 2023-10-26 NOTE — Progress Notes (Deleted)
 [name]  [place] [address]  Dept: [phone]      Physical Therapy Daily Note     Referring MD: Milinda Antis, MD  Diagnosis:   No diagnosis found.     Therapy precautions:None  Co-morbidities affecting p

## 2023-10-31 ENCOUNTER — Encounter
Admit: 2023-10-31 | Discharge: 2023-10-31 | Attending: Rehabilitative and Restorative Service Providers" | Admitting: Rehabilitative and Restorative Service Providers"

## 2023-10-31 DIAGNOSIS — M25511 Pain in right shoulder: Principal | ICD-10-CM

## 2023-10-31 NOTE — Progress Notes (Signed)
 [name]  [place] [address]  Dept: [phone]      Physical Therapy Daily Note     Referring MD: Romeo Co, MD  Diagnosis:     ICD-10-CM    1. Right shoulder pain, unspecified chronicity  M25.511       2. Decreased activities of daily living (ADL)  Z78.9       3. Impaired strength of shoulder muscles  R29.898       4. Decreased activity tolerance  R68.89            Therapy precautions:None  Co-morbidities affecting plan of care: None    Payor: Payor: Advertising copywriter /  /  /  Billing pattern: Self-Pay- No Insurance on file  Total Timed Procedure Codes: 40 min, Total Time: 40 min Modifier needed: No  Episode visit count:  7     PERTINENT MEDICAL HISTORY     Past medical and surgical history:   No past medical history on file.   No past surgical history on file.  Medications: reviewed in chart   Allergies: No Known Allergies     Diagnostic exams (per chart review): 06/21/23: Impression  1.  Mild rotator cuff tendinopathy without tear.  2.  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    SUBJECTIVE     Shoulder continues to hurt.    Patient Stated Goals: Get my shoulder back to 100%, back to 'normal'.    OBJECTIVE     HAND DOMINANCE: right handed  FINDINGS:  PENN SHOULDER SCORE 10/10/23   PAIN 10/30   SATISFACTION 1/10   FUNCTION 31/60   TOTAL 42/100     LEFT  (NON - INVOLVED) RIGHT SHOULDER AROM ()  (INVOLVED) 10/10/23     157 FE 127 (81%)   150 ABDUCTION 130 (87%)    REACHING TO OPPOSITE SHOULDER     REACHING TO BACK OF NECK     REACHING BEHIND BACK          Today's treatment:   PATIENT EDUCATION:   Patient performed each exercise w PT correcting technique as needed w redemonstration, verbal or tactile cueing.   PT answering any patient questions.    91478 - THERAPEUTIC EXERCISE: 40 MIN.  Addressing pain and deficits related to ROM and mm function - force production, endurance, neuromuscular coordination for  completing ADLs.     EXERCISES:  To address pain and deficits related to ROM and mm function - force production, endurance, neuromuscular coordination for completing ADLs.    [x] UBE (seat position/intensity/duration): 6/2/5    [x] Bilateral rows: 8#/3/10  [x] Bilateral extension: 3#/3/10  [x] Bilateral prone laterals: 1#/3/10  [x] R ER: 1#/3/10  [] Bilateral presses:  [x] Bilateral scaption: 1#/3/10  [x] Bilateral curls: 5#/3/10    [] Quadrant stretch 5 sec hold/5  [] Posterior capsule stretch 5 sec hold/5    [] Shoulder extension - bilateral Stroops - M 2/25  [] Chest press Stroops - M 2/25  [] Curls Stroops - M 2/25  [] Shoulder ER - bilateral Stroops - M 2/25  [] Shoulder IR - bilateral Stroops - M 2/25  [] FE Stroops - VL 2/10      ASSESSMENT     Start of care: 08/24/2023   Progress Report Period: 08/24/23 to 10/31/23   As of 10/31/2023, Adam Snyder has attended 7 PT sessions. Pt's attendance has been inconsistent with plan of care.  Pt has progressed slower than anticipated with treatment. He has met 3/4  short term goals, has subjective reports of continued shoulder pain. Objective findings revealed function per PSS 42% and AROM of 127 in flexion (19% deficit) and 130 in abduction (13% deficit).   Continued deficits include:  Pain  ROM limitations  Strength deficits  Motor control deficits  Decreased endurance/activity Tolerance  ADL/functional limitations/modifications  Decreased Independence with Home Exercise Program.     Pt has not achieved max rehab potential and would benefit from continued skilled PT to address the above impairments and continue progress towards remaining therapy goals.     PLAN      At next session:  [] Assess response to today's session - TE   [] Measure shoulder AROM  [] Complete PSS  [] Vaso for pain, swelling    PLAN OF CARE     Effective Dates: 10/31/2023 TO 11/30/2022     Frequency/Duration: 1 - 2x/wk   Interventions  may include but are not limited to:   ([address]) Therapeutic exercise to develop  ROM, strength, endurance and flexibility  (97530) Therapeutic activities using dynamic activities to improve function  (97140) Manual therapy techniques to improve joint and/or soft tissue mobility, ROM, and function as well as helping to decrease pain/spasms and swelling  (60454) Neuromuscular reeducation addressing impaired balance, coordination, kinesthetic sense, posture and proprioception  Modalities prn to address pain, spasms, and swelling: (09811) Vasopneumatic compression  (97010) Hot/cold pack     The referring medical provider has reviewed and approved this evaluation and plan of care as noted by the electronic signature attached to note.    GOALS     LONG - TERM GOALS TO BE ATTAINED BY 12/01/23:  Patient will report infrequent shoulder pain of  <= 3/10 that does not limit mobility or ADLs.   Patient will return to normal sleep pattern re quality and duration.   Patient will attain shoulder function - ROM, mm strength - endurance, coordination of movement - that allows patient to:  Dress, sleep and perform light work w/o shoulder pain  Patient will be independent managing activities in order to avoid exacerbation of shoulder impairment.   Patient will be independent w a maintenance program.     Med[employer]    Access Code: [email or web address]  URL: [email or web address]  Date: 08/28/2023  Prepared by: Marcella Serge    Exercises  - Quadrant Stretch  - 1 x daily - 1 sets - 5 reps - 5 hold  - Shoulder extension with resistance - Neutral  - 1 x daily - 1 sets - 15 reps  - Shoulder External Rotation with Anchored Resistance with Towel Under Elbow  - 1 x daily - 1 sets - 15 reps  - T Band Resisted Shoulder IR  - 1 x daily - 1 sets - 15 reps  - T Band Resisted Biceps Curls  - 1 x daily - 1 sets - 15 reps

## 2023-11-08 ENCOUNTER — Encounter
Admit: 2023-11-08 | Discharge: 2023-11-08 | Payer: PRIVATE HEALTH INSURANCE | Attending: Rehabilitative and Restorative Service Providers"

## 2023-11-08 DIAGNOSIS — M25511 Pain in right shoulder: Secondary | ICD-10-CM

## 2023-11-08 NOTE — Progress Notes (Signed)
[name]  [place] [address]  Dept: [phone]      Physical Therapy Daily Note     Referring MD: Milinda Antis, MD  Diagnosis:     ICD-10-CM    1. Right shoulder pain, unspecified chronicity  M25.511       2. Decreased activities of daily living (ADL)  Z78.9       3. Impaired strength of shoulder muscles  R29.898       4. Decreased activity tolerance  R68.89            Therapy precautions:None  Co-morbidities affecting plan of care: None    Payor: Payor: Advertising copywriter /  /  /  Billing pattern: Self-Pay- No Insurance on file  Total Timed Procedure Codes: 60 min, Total Time: 60 min Modifier needed: No  Episode visit count:  8     PERTINENT MEDICAL HISTORY     Past medical and surgical history:   No past medical history on file.   No past surgical history on file.  Medications: reviewed in chart   Allergies: No Known Allergies     Diagnostic exams (per chart review): 06/21/23: Impression  1.  Mild rotator cuff tendinopathy without tear.  2.  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    SUBJECTIVE     Shoulder pain is about the same.  I just deal w it.    Patient Stated Goals: Get my shoulder back to 100%, back to 'normal'.    OBJECTIVE     HAND DOMINANCE: right handed  FINDINGS:  PENN SHOULDER SCORE 10/10/23 11/08/23   PAIN 10/30 8/30   SATISFACTION 1/10 3/10   FUNCTION 31/60 42/60   TOTAL 42/100 53/100     LEFT  (NON - INVOLVED) RIGHT SHOULDER AROM ()  (INVOLVED) 10/10/23      157 FE 127 (81%)    150 ABDUCTION 130 (87%)     REACHING TO OPPOSITE SHOULDER      REACHING TO BACK OF NECK      REACHING BEHIND BACK           Today's treatment:   PATIENT EDUCATION:   Patient performed each exercise w PT correcting technique as needed w redemonstration, verbal or tactile cueing.   PT answering any patient questions.    97110 - THERAPEUTIC EXERCISE: 60 MIN.  Addressing pain and deficits related to ROM and mm function - force  production, endurance, neuromuscular coordination for completing ADLs.     EXERCISES:  To address pain and deficits related to ROM and mm function - force production, endurance, neuromuscular coordination for completing ADLs.    [x] UBE (seat position/intensity/duration): 6/3/5    [x] Bilateral rows: 8#/3/15  [x] Bilateral extension: 3#/3/15  [x] Bilateral prone laterals: 1#/3/15  [] Bilateral presses:  [x] Bilateral scaption: 3#/3/15  [x] Bilateral curls: 8#/3/15  [x] Bilateral SA punch: 8#/2/15  [x] R ER: 1#/3/25  [] Quadrant stretch 5 sec hold/5  [] Posterior capsule stretch 5 sec hold/5    [] Shoulder extension - bilateral Stroops - M 2/25  [] Chest press Stroops - M 2/25  [] Curls Stroops - M 2/25  [] Shoulder ER - bilateral Stroops - M 2/25  [] Shoulder IR - bilateral Stroops - M 2/25  [] FE Stroops - VL 2/10      ASSESSMENT     Reassessment of function per PSS indicates an 11 point improvement from 42% (10/10/23) to 53% today.  His comments are essentially unchanged w continuation  of pain that he 'deals with'.   Today's session saw the increase of exercise resistance and volume to 3 sets of 15 reps.    IMPAIRMENTS/FUNCTIONAL LIMITATIONS:  Pain  ROM limitations  Strength deficits  Motor control deficits  Decreased endurance/activity Tolerance  ADL/functional limitations/modifications  Decreased Independence with Home Exercise Program.     Pt has not achieved max rehab potential and would benefit from continued skilled PT to address the above impairments and continue progress towards remaining therapy goals.     PLAN      At next session:  [x] Assess response to today's session - TE   [x] Measure shoulder AROM  [] Complete PSS  [] Vaso for pain, swelling    PLAN OF CARE     Effective Dates: 10/31/2023 TO 11/30/2022     Frequency/Duration: 1 - 2x/wk   Interventions  may include but are not limited to:   (35573) Therapeutic exercise to develop ROM, strength, endurance and flexibility  (97530) Therapeutic activities using dynamic  activities to improve function  (97140) Manual therapy techniques to improve joint and/or soft tissue mobility, ROM, and function as well as helping to decrease pain/spasms and swelling  (22025) Neuromuscular reeducation addressing impaired balance, coordination, kinesthetic sense, posture and proprioception  Modalities prn to address pain, spasms, and swelling: (97016) Vasopneumatic compression  (97010) Hot/cold pack       GOALS     LONG - TERM GOALS TO BE ATTAINED BY 12/01/23:  Patient will report infrequent shoulder pain of  <= 3/10 that does not limit mobility or ADLs.   Patient will return to normal sleep pattern re quality and duration.   Patient will attain shoulder function - ROM, mm strength - endurance, coordination of movement - that allows patient to:  Dress, sleep and perform light work w/o shoulder pain  Patient will be independent managing activities in order to avoid exacerbation of shoulder impairment.   Patient will be independent w a maintenance program.     Med[employer]    Access Code: [email or web address]  URL: [email or web address]  Date: 08/28/2023  Prepared by: Salvadore Farber    Exercises  - Quadrant Stretch  - 1 x daily - 1 sets - 5 reps - 5 hold  - Shoulder extension with resistance - Neutral  - 1 x daily - 1 sets - 15 reps  - Shoulder External Rotation with Anchored Resistance with Towel Under Elbow  - 1 x daily - 1 sets - 15 reps  - T Band Resisted Shoulder IR  - 1 x daily - 1 sets - 15 reps  - T Band Resisted Biceps Curls  - 1 x daily - 1 sets - 15 reps

## 2023-11-09 ENCOUNTER — Encounter: Payer: PRIVATE HEALTH INSURANCE | Attending: Rehabilitative and Restorative Service Providers"

## 2023-11-13 ENCOUNTER — Encounter: Payer: PRIVATE HEALTH INSURANCE | Attending: Rehabilitative and Restorative Service Providers"

## 2023-11-13 NOTE — Progress Notes (Deleted)
[facility] Royal   [place] [address]  Dept: [phone]      Physical Therapy Daily Note     Referring MD: Milinda Antis, MD  Diagnosis:   No diagnosis found.       Therapy precautions:None  Co-morbidities affecting plan of care: None    Payor: Payor: Advertising copywriter /  /  /  Billing pattern: Self-Pay- No Insurance on file  Total Timed Procedure Codes: 60 min, Total Time: 60 min Modifier needed: No  Episode visit count:  Visit count could not be calculated. Make sure you are using a visit which is associated with an episode.     PERTINENT MEDICAL HISTORY     Past medical and surgical history:   No past medical history on file.   No past surgical history on file.  Medications: reviewed in chart   Allergies: No Known Allergies     Diagnostic exams (per chart review): 06/21/23: Impression  1.  Mild rotator cuff tendinopathy without tear.  2.  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    SUBJECTIVE     Shoulder pain is about the same.  I just deal w it.    Patient Stated Goals: Get my shoulder back to 100%, back to 'normal'.    OBJECTIVE     HAND DOMINANCE: right handed  FINDINGS:  PENN SHOULDER SCORE 10/10/23 11/08/23   PAIN 10/30 8/30   SATISFACTION 1/10 3/10   FUNCTION 31/60 42/60   TOTAL 42/100 53/100     LEFT  (NON - INVOLVED) RIGHT SHOULDER AROM ()  (INVOLVED) 10/10/23      157 FE 127 (81%)    150 ABDUCTION 130 (87%)     REACHING TO OPPOSITE SHOULDER      REACHING TO BACK OF NECK      REACHING BEHIND BACK           Today's treatment:   PATIENT EDUCATION:   Patient performed each exercise w PT correcting technique as needed w redemonstration, verbal or tactile cueing.   PT answering any patient questions.    97110 - THERAPEUTIC EXERCISE: 60 MIN.  Addressing pain and deficits related to ROM and mm function - force production, endurance, neuromuscular coordination for completing ADLs.     EXERCISES:  To address pain and deficits  related to ROM and mm function - force production, endurance, neuromuscular coordination for completing ADLs.    [x] UBE (seat position/intensity/duration): 6/3/5    [x] Bilateral rows: 8#/3/15  [x] Bilateral extension: 3#/3/15  [x] Bilateral prone laterals: 1#/3/15  [] Bilateral presses:  [x] Bilateral scaption: 3#/3/15  [x] Bilateral curls: 8#/3/15  [x] Bilateral SA punch: 8#/2/15  [x] R ER: 1#/3/25  [] Quadrant stretch 5 sec hold/5  [] Posterior capsule stretch 5 sec hold/5    [] Shoulder extension - bilateral Stroops - M 2/25  [] Chest press Stroops - M 2/25  [] Curls Stroops - M 2/25  [] Shoulder ER - bilateral Stroops - M 2/25  [] Shoulder IR - bilateral Stroops - M 2/25  [] FE Stroops - VL 2/10      ASSESSMENT     Reassessment of function per PSS indicates an 11 point improvement from 42% (10/10/23) to 53% today.  His comments are essentially unchanged w continuation of pain that he 'deals with'.   Today's session saw the increase of exercise resistance and volume to 3 sets of 15 reps.    IMPAIRMENTS/FUNCTIONAL LIMITATIONS:  Pain  ROM limitations  Strength deficits  Motor  control deficits  Decreased endurance/activity Tolerance  ADL/functional limitations/modifications  Decreased Independence with Home Exercise Program.     Pt has not achieved max rehab potential and would benefit from continued skilled PT to address the above impairments and continue progress towards remaining therapy goals.     PLAN      At next session:  [x] Assess response to today's session - TE   [x] Measure shoulder AROM  [] Complete PSS  [] Vaso for pain, swelling    PLAN OF CARE     Effective Dates: 10/31/2023 TO 11/30/2022     Frequency/Duration: 1 - 2x/wk   Interventions  may include but are not limited to:   ([address]) Therapeutic exercise to develop ROM, strength, endurance and flexibility  (97530) Therapeutic activities using dynamic activities to improve function  (97140) Manual therapy techniques to improve joint and/or soft tissue mobility, ROM, and  function as well as helping to decrease pain/spasms and swelling  (60454) Neuromuscular reeducation addressing impaired balance, coordination, kinesthetic sense, posture and proprioception  Modalities prn to address pain, spasms, and swelling: (97016) Vasopneumatic compression  (97010) Hot/cold pack       GOALS     LONG - TERM GOALS TO BE ATTAINED BY 12/01/23:  Patient will report infrequent shoulder pain of  <= 3/10 that does not limit mobility or ADLs.   Patient will return to normal sleep pattern re quality and duration.   Patient will attain shoulder function - ROM, mm strength - endurance, coordination of movement - that allows patient to:  Dress, sleep and perform light work w/o shoulder pain  Patient will be independent managing activities in order to avoid exacerbation of shoulder impairment.   Patient will be independent w a maintenance program.     [employer]    Access Code: [email or web address]  URL: [email or web address]  Date: 08/28/2023  Prepared by: Salvadore Farber    Exercises  - Quadrant Stretch  - 1 x daily - 1 sets - 5 reps - 5 hold  - Shoulder extension with resistance - Neutral  - 1 x daily - 1 sets - 15 reps  - Shoulder External Rotation with Anchored Resistance with Towel Under Elbow  - 1 x daily - 1 sets - 15 reps  - T Band Resisted Shoulder IR  - 1 x daily - 1 sets - 15 reps  - T Band Resisted Biceps Curls  - 1 x daily - 1 sets - 15 reps

## 2023-11-13 NOTE — Telephone Encounter (Signed)
NS.  Attempted to contact by phone - no answer, unable to leave message.  Will send NS letter.

## 2023-11-15 ENCOUNTER — Encounter
Admit: 2023-11-15 | Discharge: 2023-11-15 | Payer: PRIVATE HEALTH INSURANCE | Attending: Rehabilitative and Restorative Service Providers"

## 2023-11-15 DIAGNOSIS — M25511 Pain in right shoulder: Secondary | ICD-10-CM

## 2023-11-15 NOTE — Progress Notes (Signed)
[facility]  [place] [address]  Dept: [phone]      Physical Therapy Daily Note     Referring MD: Milinda Antis, MD  Diagnosis:     ICD-10-CM    1. Right shoulder pain, unspecified chronicity  M25.511       2. Decreased activities of daily living (ADL)  Z78.9       3. Impaired strength of shoulder muscles  R29.898       4. Decreased activity tolerance  R68.89              Therapy precautions:None  Co-morbidities affecting plan of care: None    Payor: Payor: Advertising copywriter /  /  /  Billing pattern: Self-Pay- No Insurance on file  Total Timed Procedure Codes: 40 min, Total Time: 40 min Modifier needed: No  Episode visit count:  9     PERTINENT MEDICAL HISTORY     Past medical and surgical history:   No past medical history on file.   No past surgical history on file.  Medications: reviewed in chart   Allergies: No Known Allergies     Diagnostic exams (per chart review): 06/21/23: Impression  1.  Mild rotator cuff tendinopathy without tear.  2.  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    SUBJECTIVE     Shoulder pain is about the same.  I just deal w it.    Patient Stated Goals: Get my shoulder back to 100%, back to 'normal'.    OBJECTIVE     HAND DOMINANCE: right handed  FINDINGS:  PENN SHOULDER SCORE 10/10/23 11/08/23   PAIN 10/30 8/30   SATISFACTION 1/10 3/10   FUNCTION 31/60 42/60   TOTAL 42/100 53/100     LEFT  (NON - INVOLVED) RIGHT SHOULDER AROM ()  (INVOLVED) 10/10/23      157 FE 127 (81%)    150 ABDUCTION 130 (87%)     REACHING TO OPPOSITE SHOULDER      REACHING TO BACK OF NECK      REACHING BEHIND BACK           Today's treatment:   PATIENT EDUCATION:   Patient performed each exercise w PT correcting technique as needed w redemonstration, verbal or tactile cueing.   PT answering any patient questions.    52841 - THERAPEUTIC EXERCISE: 40 MIN.  Addressing pain and deficits related to ROM and mm function - force  production, endurance, neuromuscular coordination for completing ADLs.     EXERCISES:  To address pain and deficits related to ROM and mm function - force production, endurance, neuromuscular coordination for completing ADLs.    [x] UBE (seat position/intensity/duration): 6/3/5    [x] Bilateral rows: 10#/3/15  [x] Bilateral extension: 5#/3/15  [x] Bilateral prone laterals: 3#/3/15  [] Bilateral presses:  [x] Bilateral scaption: 3#/3/15  [x] Bilateral curls: 8#/3/15  [] Bilateral SA punch: 8#/2/15  [] R ER: 1#/3/25  [] Quadrant stretch 5 sec hold/5  [] Posterior capsule stretch 5 sec hold/5    [] Shoulder extension - bilateral Stroops - M 2/25  [] Chest press Stroops - M 2/25  [] Curls Stroops - M 2/25  [] Shoulder ER - bilateral Stroops - M 2/25  [] Shoulder IR - bilateral Stroops - M 2/25  [] FE Stroops - VL 2/10      ASSESSMENT     Subjective complaints unchanged.Completed session w/o any complaint of provocation.    IMPAIRMENTS/FUNCTIONAL LIMITATIONS:  Pain  ROM limitations  Strength deficits  Motor control deficits  Decreased endurance/activity Tolerance  ADL/functional limitations/modifications  Decreased Independence with Home Exercise Program.     Pt has not achieved max rehab potential and would benefit from continued skilled PT to address the above impairments and continue progress towards remaining therapy goals.     PLAN      At next session:  [x] Assess response to today's session - TE   [x] Measure shoulder AROM  [] Complete PSS  [] Vaso for pain, swelling    PLAN OF CARE     Effective Dates: 10/31/2023 TO 11/30/2022     Frequency/Duration: 1 - 2x/wk   Interventions  may include but are not limited to:   (13244) Therapeutic exercise to develop ROM, strength, endurance and flexibility  (97530) Therapeutic activities using dynamic activities to improve function  (97140) Manual therapy techniques to improve joint and/or soft tissue mobility, ROM, and function as well as helping to decrease pain/spasms and swelling  (01027)  Neuromuscular reeducation addressing impaired balance, coordination, kinesthetic sense, posture and proprioception  Modalities prn to address pain, spasms, and swelling: (97016) Vasopneumatic compression  (97010) Hot/cold pack       GOALS     LONG - TERM GOALS TO BE ATTAINED BY 12/01/23:  Patient will report infrequent shoulder pain of  <= 3/10 that does not limit mobility or ADLs.   Patient will return to normal sleep pattern re quality and duration.   Patient will attain shoulder function - ROM, mm strength - endurance, coordination of movement - that allows patient to:  Dress, sleep and perform light work w/o shoulder pain  Patient will be independent managing activities in order to avoid exacerbation of shoulder impairment.   Patient will be independent w a maintenance program.     [facility]    Access Code: [email or web address]  URL: [email or web address]  Date: 08/28/2023  Prepared by: Salvadore Farber    Exercises  - Quadrant Stretch  - 1 x daily - 1 sets - 5 reps - 5 hold  - Shoulder extension with resistance - Neutral  - 1 x daily - 1 sets - 15 reps  - Shoulder External Rotation with Anchored Resistance with Towel Under Elbow  - 1 x daily - 1 sets - 15 reps  - T Band Resisted Shoulder IR  - 1 x daily - 1 sets - 15 reps  - T Band Resisted Biceps Curls  - 1 x daily - 1 sets - 15 reps

## 2023-11-17 ENCOUNTER — Encounter: Payer: PRIVATE HEALTH INSURANCE | Attending: Rehabilitative and Restorative Service Providers"

## 2023-11-20 ENCOUNTER — Encounter: Payer: PRIVATE HEALTH INSURANCE | Attending: Rehabilitative and Restorative Service Providers"

## 2023-11-22 ENCOUNTER — Encounter
Admit: 2023-11-22 | Discharge: 2023-11-22 | Payer: PRIVATE HEALTH INSURANCE | Attending: Rehabilitative and Restorative Service Providers"

## 2023-11-22 DIAGNOSIS — M25511 Pain in right shoulder: Secondary | ICD-10-CM

## 2023-11-23 ENCOUNTER — Encounter: Payer: PRIVATE HEALTH INSURANCE | Attending: Rehabilitative and Restorative Service Providers"

## 2023-11-23 NOTE — Progress Notes (Signed)
[facility]  [place] [address]  Dept: [phone]      Physical Therapy Daily Note     Referring MD: Milinda Antis, MD  Diagnosis:     ICD-10-CM    1. Right shoulder pain, unspecified chronicity  M25.511       2. Decreased activities of daily living (ADL)  Z78.9       3. Impaired strength of shoulder muscles  R29.898       4. Decreased activity tolerance  R68.89              Therapy precautions:None  Co-morbidities affecting plan of care: None    Payor: Payor: Advertising copywriter /  /  /  Billing pattern: Self-Pay- No Insurance on file  Total Timed Procedure Codes: 40 min, Total Time: 40 min Modifier needed: No  Episode visit count:  10     PERTINENT MEDICAL HISTORY     Past medical and surgical history:   No past medical history on file.   No past surgical history on file.  Medications: reviewed in chart   Allergies: No Known Allergies     Diagnostic exams (per chart review): 06/21/23: Impression  1.  Mild rotator cuff tendinopathy without tear.  2.  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    SUBJECTIVE     Shoulder pain is about the same.  I just deal w it.    Patient Stated Goals: Get my shoulder back to 100%, back to 'normal'.    OBJECTIVE     HAND DOMINANCE: right handed  FINDINGS:  PENN SHOULDER SCORE 10/10/23 11/08/23   PAIN 10/30 8/30   SATISFACTION 1/10 3/10   FUNCTION 31/60 42/60   TOTAL 42/100 53/100     LEFT  (NON - INVOLVED) RIGHT SHOULDER AROM ()  (INVOLVED) 10/10/23      157 FE 127 (81%)    150 ABDUCTION 130 (87%)     REACHING TO OPPOSITE SHOULDER      REACHING TO BACK OF NECK      REACHING BEHIND BACK           Today's treatment:   PATIENT EDUCATION:   Patient performed each exercise w PT correcting technique as needed w redemonstration, verbal or tactile cueing.   PT answering any patient questions.    24401 - THERAPEUTIC EXERCISE: 40 MIN.  Addressing pain and deficits related to ROM and mm function - force  production, endurance, neuromuscular coordination for completing ADLs.     EXERCISES:  To address pain and deficits related to ROM and mm function - force production, endurance, neuromuscular coordination for completing ADLs.    [x] UBE (seat position/intensity/duration): 6/3/5  Manually resisted exercise - patient supine:  [x] Flexion/extension   [x] ER/IR   [x] D1 flexion/extension   [x] D2 flexion/extension  [x] Abduction/adduction - side lying  [x] MT - Gr II - III PA and distraction glides of GHJ    [] Bilateral rows: 10#/3/15  [] Bilateral extension: 5#/3/15  [] Bilateral prone laterals: 3#/3/15  [] Bilateral presses:  [] Bilateral scaption: 3#/3/15  [] Bilateral curls: 8#/3/15  [] Bilateral SA punch: 8#/2/15  [] R ER: 1#/3/25  [] Quadrant stretch 5 sec hold/5  [] Posterior capsule stretch 5 sec hold/5      ASSESSMENT     Subjective complaints unchanged. Completed session w/o any complaint of provocation.    IMPAIRMENTS/FUNCTIONAL LIMITATIONS:  Pain  ROM limitations  Strength deficits  Motor control deficits  Decreased endurance/activity Tolerance  ADL/functional limitations/modifications  Decreased Independence with Home Exercise Program.     Pt has not achieved max rehab potential and would benefit from continued skilled PT to address the above impairments and continue progress towards remaining therapy goals.     PLAN      At next session:  [x] Assess response to today's session - TE   [x] Measure shoulder AROM  [] Complete PSS  [] Vaso for pain, swelling    PLAN OF CARE     Effective Dates: 10/31/2023 TO 11/30/2022     Frequency/Duration: 1 - 2x/wk   Interventions  may include but are not limited to:   ([address]11) Therapeutic exercise to develop ROM, strength, endurance and flexibility  (97530) Therapeutic activities using dynamic activities to improve function  (97140) Manual therapy techniques to improve joint and/or soft tissue mobility, ROM, and function as well as helping to decrease pain/spasms and swelling  (91478)  Neuromuscular reeducation addressing impaired balance, coordination, kinesthetic sense, posture and proprioception  Modalities prn to address pain, spasms, and swelling: (97016) Vasopneumatic compression  (97010) Hot/cold pack       GOALS     LONG - TERM GOALS TO BE ATTAINED BY 12/01/23:  Patient will report infrequent shoulder pain of  <= 3/10 that does not limit mobility or ADLs.   Patient will return to normal sleep pattern re quality and duration.   Patient will attain shoulder function - ROM, mm strength - endurance, coordination of movement - that allows patient to:  Dress, sleep and perform light work w/o shoulder pain  Patient will be independent managing activities in order to avoid exacerbation of shoulder impairment.   Patient will be independent w a maintenance program.     [facility]    Access Code: [email or web address]  URL: [email or web address]  Date: 08/28/2023  Prepared by: Salvadore Farber    Exercises  - Quadrant Stretch  - 1 x daily - 1 sets - 5 reps - 5 hold  - Shoulder extension with resistance - Neutral  - 1 x daily - 1 sets - 15 reps  - Shoulder External Rotation with Anchored Resistance with Towel Under Elbow  - 1 x daily - 1 sets - 15 reps  - T Band Resisted Shoulder IR  - 1 x daily - 1 sets - 15 reps  - T Band Resisted Biceps Curls  - 1 x daily - 1 sets - 15 reps

## 2023-11-24 ENCOUNTER — Encounter: Payer: PRIVATE HEALTH INSURANCE | Attending: Rehabilitative and Restorative Service Providers"

## 2023-11-24 NOTE — Telephone Encounter (Signed)
NS.  Called patient. He thought his appt was at 4P instead of 3P.  Rescheduled.

## 2023-11-24 NOTE — Progress Notes (Deleted)
 [facility]  [place] [address]  Dept: [phone]      Physical Therapy Daily Note     Referring MD: Milinda Antis, MD  Diagnosis:     ICD-10-CM    1. Right shoulder pain, unspecified chronicity  M25.511       2. Decreased activities of daily living (ADL)  Z78.9       3. Impaired strength of shoulder muscles  R29.898       4. Decreased activity tolerance  R68.89              Therapy precautions:None  Co-morbidities affecting plan of care: None    Payor: Payor: Advertising copywriter /  /  /  Billing pattern: Self-Pay- No Insurance on file  Total Timed Procedure Codes: *** min, Total Time: *** min Modifier needed: No  Episode visit count:  Visit count could not be calculated. Make sure you are using a visit which is associated with an episode.     PERTINENT MEDICAL HISTORY     Past medical and surgical history:   No past medical history on file.   No past surgical history on file.  Medications: reviewed in chart   Allergies: No Known Allergies     Diagnostic exams (per chart review): 06/21/23: Impression  1.  Mild rotator cuff tendinopathy without tear.  2.  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    SUBJECTIVE     ***Shoulder pain is about the same.  I just deal w it.    Patient Stated Goals: Get my shoulder back to 100%, back to 'normal'.    OBJECTIVE     HAND DOMINANCE: right handed  FINDINGS:  PENN SHOULDER SCORE 10/10/23 11/08/23   PAIN 10/30 8/30   SATISFACTION 1/10 3/10   FUNCTION 31/60 42/60   TOTAL 42/100 53/100     LEFT  (NON - INVOLVED) RIGHT SHOULDER AROM ()  (INVOLVED) 10/10/23   ***   157 FE 127 (81%) ***   150 ABDUCTION 130 (87%) ***    REACHING TO OPPOSITE SHOULDER      REACHING TO BACK OF NECK      REACHING BEHIND BACK           Today's treatment:   PATIENT EDUCATION:   Patient performed each exercise w PT correcting technique as needed w redemonstration, verbal or tactile cueing.   PT answering any patient  questions.    91478 - THERAPEUTIC EXERCISE: *** MIN.  Addressing pain and deficits related to ROM and mm function - force production, endurance, neuromuscular coordination for completing ADLs.     ***EXERCISES:  To address pain and deficits related to ROM and mm function - force production, endurance, neuromuscular coordination for completing ADLs.    [x] UBE (seat position/intensity/duration): 6/3/5  [] Quadrant stretch 5 sec hold/5  [] Posterior capsule stretch 5 sec hold/5    Manually resisted exercise - patient supine: 90 sec each movement  [x] Flexion/extension   [x] ER/IR   [x] D1 flexion/extension   [x] D2 flexion/extension  [x] Abduction/adduction - side lying  [x] MT - Gr II - III PA and distraction glides of GHJ    [] Bilateral rows: 3 x 10#/15  [] Bilateral extension: 3 x 5#/3/15  [] Bilateral prone laterals: 3 x 3#/3/15  [] Bilateral presses:  [] Bilateral scaption: 3 x 3#/3/15  [] Bilateral curls: 3 x 8#/3/15  [] Bilateral SA punch: 3 x 8#/2/15  [] R ER: 3 x 1#/3/25  ASSESSMENT     ***Subjective complaints unchanged. Completed session w/o any complaint of provocation.    IMPAIRMENTS/FUNCTIONAL LIMITATIONS:  ***Pain  ROM limitations  Strength deficits  Motor control deficits  Decreased endurance/activity Tolerance  ADL/functional limitations/modifications  Decreased Independence with Home Exercise Program.     Pt has not achieved max rehab potential and would benefit from continued skilled PT to address the above impairments and continue progress towards remaining therapy goals.     PLAN      At next session:  [x] Assess response to today's session - TE   [x] Measure shoulder AROM  [] Complete PSS  [] Vaso for pain, swelling    PLAN OF CARE     Effective Dates: 10/31/2023 TO 11/30/2022     Frequency/Duration: 1 - 2x/wk   Interventions  may include but are not limited to:   (91478) Therapeutic exercise to develop ROM, strength, endurance and flexibility  (97530) Therapeutic activities using dynamic activities to improve  function  (97140) Manual therapy techniques to improve joint and/or soft tissue mobility, ROM, and function as well as helping to decrease pain/spasms and swelling  (29562) Neuromuscular reeducation addressing impaired balance, coordination, kinesthetic sense, posture and proprioception  Modalities prn to address pain, spasms, and swelling: (97016) Vasopneumatic compression  (97010) Hot/cold pack       GOALS     LONG - TERM GOALS TO BE ATTAINED BY 12/01/23:  Patient will report infrequent shoulder pain of  <= 3/10 that does not limit mobility or ADLs.   Patient will return to normal sleep pattern re quality and duration.   Patient will attain shoulder function - ROM, mm strength - endurance, coordination of movement - that allows patient to:  Dress, sleep and perform light work w/o shoulder pain  Patient will be independent managing activities in order to avoid exacerbation of shoulder impairment.   Patient will be independent w a maintenance program.     [facility]    Access Code: [email or web address]  URL: [email or web address]  Date: 08/28/2023  Prepared by: Salvadore Farber    Exercises  - Quadrant Stretch  - 1 x daily - 1 sets - 5 reps - 5 hold  - Shoulder extension with resistance - Neutral  - 1 x daily - 1 sets - 15 reps  - Shoulder External Rotation with Anchored Resistance with Towel Under Elbow  - 1 x daily - 1 sets - 15 reps  - T Band Resisted Shoulder IR  - 1 x daily - 1 sets - 15 reps  - T Band Resisted Biceps Curls  - 1 x daily - 1 sets - 15 reps

## 2023-11-25 ENCOUNTER — Inpatient Hospital Stay
Admit: 2023-11-25 | Discharge: 2023-11-26 | Disposition: A | Discharge status: Home / Self Care | Payer: PRIVATE HEALTH INSURANCE

## 2023-11-25 DIAGNOSIS — M5459 Other low back pain: Secondary | ICD-10-CM

## 2023-11-25 DIAGNOSIS — M545 Low back pain, unspecified: Secondary | ICD-10-CM

## 2023-11-25 LAB — CBC WITH AUTO DIFFERENTIAL
Basophils %: 0.9 % (ref 0.0–2.0)
Basophils Absolute: 0.05 10*3/uL (ref 0.00–0.20)
Eosinophils %: 2.5 % (ref 0.5–7.8)
Eosinophils Absolute: 0.14 10*3/uL (ref 0.00–0.80)
Hematocrit: 44.1 % (ref 41.1–50.3)
Hemoglobin: 14.6 g/dL (ref 13.6–17.2)
Immature Granulocytes %: 0.2 % (ref 0.0–5.0)
Immature Granulocytes Absolute: 0.01 10*3/uL (ref 0.0–0.5)
Lymphocytes %: 50 % — ABNORMAL HIGH (ref 13.0–44.0)
Lymphocytes Absolute: 2.82 10*3/uL (ref 0.50–4.60)
MCH: 30.4 pg (ref 26.1–32.9)
MCHC: 33.1 g/dL (ref 31.4–35.0)
MCV: 91.7 fL (ref 82–102)
MPV: 10.7 fL (ref 9.4–12.3)
Monocytes %: 5.7 % (ref 4.0–12.0)
Monocytes Absolute: 0.32 10*3/uL (ref 0.10–1.30)
Neutrophils %: 40.7 % — ABNORMAL LOW (ref 43.0–78.0)
Neutrophils Absolute: 2.3 10*3/uL (ref 1.70–8.20)
Platelets: 204 10*3/uL (ref 150–450)
RBC: 4.81 M/uL (ref 4.23–5.6)
RDW: 12.8 % (ref 11.9–14.6)
WBC: 5.6 10*3/uL (ref 4.3–11.1)
nRBC: 0 10*3/uL (ref 0.0–0.2)

## 2023-11-25 MED ORDER — KETOROLAC TROMETHAMINE 30 MG/ML IJ SOLN
30 | INTRAMUSCULAR | Status: AC
Start: 2023-11-25 — End: 2023-11-25
  Administered 2023-11-25: 30 mg via INTRAMUSCULAR

## 2023-11-25 MED ORDER — CYCLOBENZAPRINE HCL 10 MG PO TABS
10 | ORAL | Status: AC
Start: 2023-11-25 — End: 2023-11-25
  Administered 2023-11-25: 10 mg via ORAL

## 2023-11-25 MED FILL — KETOROLAC TROMETHAMINE 30 MG/ML IJ SOLN: 30 MG/ML | INTRAMUSCULAR | Qty: 1

## 2023-11-25 MED FILL — CYCLOBENZAPRINE HCL 10 MG PO TABS: 10 MG | ORAL | Qty: 1

## 2023-11-25 NOTE — ED Triage Notes (Signed)
Patient arrived with a severe flank pain and back pain. Patient reports of pain very sharp.

## 2023-11-25 NOTE — ED Provider Notes (Signed)
Emergency Department Provider Note       PCP: No primary care provider on file.   Age: 38 y.o.   Sex: male     DISPOSITION Decision To Discharge 11/25/2023 07:19:12 PM    ICD-10-CM    1. Acute bilateral low back pain without sciatica  M54.50           Medical Decision Making     38 y.o. male presenting with lower back pain likely musculoskeletal in nature. Differential includes cauda equina syndrome, epidural abscess, nephrolithiasis, vertebral compression fracture, and abdominal aortic aneurysm. The suspicion for these other etiologies is low as patient denies fever or incontinence or IV drug abuse or saddle anesthesia and exam with baseline strength/ sensation in BLE. The patient's urinalysis was also within normal limits which brings my suspicion for nephrolithiasis down. The mechanism of injury was also low impact. I will treat this patient symptomatically and give the patient medicine to control their pain as an outpatient.  Patient given prior fibroids, muscle laxer lidocaine patch, will send home with several medications.  CMP does not demonstrate any significant renal dysfunction.  No significant leukocytosis or anemia.  UA does not demonstrate urinary tract fraction, patient is hemodynamically stable.  No abdominal tenderness.  No complaints of chest pain/shortness of breath.  ED return precautions discussed for worsening pain, new onset fever, or new onset lower extremity weakness, changes in gait, urinary incontinence or retention, fecal incontinence, or saddle anesthesia.  Neurovascularly intact at time of discharge.    Patient was instructed to follow-up with PCP in the next 24 to 48 hours and to follow-up with all specialists given with discharge as soon as possible.  Patient is nontoxic-appearing and has no complaints at time of discharge.  Instructed to return to the emergency department immediately if current symptoms worsen, or any new/concerning symptoms develop for which they voice  understanding.  All questions answered at time of discharge.       1 or more acute illnesses that pose a threat to life or bodily function.   Patient was discharged risks and benefits of hospitalization were considered.  Shared medical decision making was utilized in creating the patients health plan today.  I independently ordered and reviewed each unique test.    I reviewed external records: ED visit note from a different ED.   I reviewed external records: provider visit note from PCP.                     History     Adam [place] is a 38 year old male with no reported PMH presents to the ED for bilateral flank pain.  Patient states that it feels similar to when he has pulled a muscle in his back, but might be worse.  Concerned that he has decreased kidney function.  States that he has been drinking a significant amount of sodas over the past week.  No history of kidney stones that he reports.  Still able to ambulate without difficulty.  Has not taken any medication prior to arrival.  Patient denies fever, dysuria, hematuria, testicular pain, penile pain, penile discharge, nausea/vomiting, chest pain, shortness of breath, IV drug use, paresthesias, bowel/bladder incontinence or urinary retention.          ROS     Review of Systems   Constitutional:  Negative for chills and fever.   HENT:  Negative for sore throat.    Eyes:  Negative for pain and redness.   Respiratory:  Negative for cough and shortness of breath.    Cardiovascular:  Negative for chest pain.   Gastrointestinal:  Negative for abdominal pain, nausea and vomiting.   Genitourinary:  Positive for flank pain. Negative for dysuria and urgency.   Musculoskeletal:  Positive for back pain. Negative for arthralgias.   Skin:  Negative for rash.   Neurological:  Negative for dizziness and headaches.        Physical Exam     Vitals signs and nursing note reviewed:  Vitals:    11/25/23 1716 11/25/23 1927 11/25/23 1932   BP: 117/84  106/66   Pulse: 72  66   Resp: 17   18   Temp: 98.2 F (36.8 C)     SpO2: 100%  99%   Weight: 83.9 kg (185 lb) 83.9 kg (185 lb)    Height:  1.905 m (6\' 3" )       Physical Exam  Constitutional:       General: He is not in acute distress.     Appearance: Normal appearance. He is not ill-appearing.   HENT:      Head: Normocephalic and atraumatic.      Right Ear: Tympanic membrane normal.      Left Ear: Tympanic membrane normal.      Mouth/Throat:      Pharynx: No oropharyngeal exudate or posterior oropharyngeal erythema.   Eyes:      Pupils: Pupils are equal, round, and reactive to light.   Cardiovascular:      Rate and Rhythm: Normal rate and regular rhythm.   Pulmonary:      Effort: Pulmonary effort is normal. No respiratory distress.      Breath sounds: No wheezing.   Abdominal:      General: Bowel sounds are normal. There is no distension.      Palpations: Abdomen is soft.      Tenderness: There is no abdominal tenderness.   Musculoskeletal:         General: No swelling, tenderness or deformity. Normal range of motion.      Cervical back: Normal range of motion and neck supple. No tenderness.   Skin:     General: Skin is warm and dry.      Capillary Refill: Capillary refill takes less than 2 seconds.      Findings: No rash.   Neurological:      General: No focal deficit present.      Mental Status: He is alert and oriented to person, place, and time.      Cranial Nerves: No cranial nerve deficit.      Motor: No weakness.        Procedures     Procedures    Orders placed during this emergency department visit:     Orders Placed This Encounter   Procedures    CBC with Auto Differential    CMP    Urinalysis with Reflex to Culture        Medications given during this emergency department visit:     Medications   ketorolac (TORADOL) injection 30 mg (30 mg IntraMUSCular Given 11/25/23 1834)   cyclobenzaprine (FLEXERIL) tablet 10 mg (10 mg Oral Given 11/25/23 1834)       New prescriptions:     Discharge Medication List as of 11/25/2023  7:25 PM        START  taking these medications    Details   cyclobenzaprine (FLEXERIL) 10 MG tablet Take 1 tablet by mouth 2  times daily as needed for Muscle spasms, Disp-12 tablet, R-0Normal      ketorolac (TORADOL) 10 MG tablet Take 1 tablet by mouth every 6 hours as needed for Pain, Disp-20 tablet, R-0Normal      lidocaine (LIDODERM) 5 % Place 1 patch onto the skin daily 12 hours on, 12 hours off., Disp-30 patch, R-0Normal              Past History and Complexity:     No past medical history on file.     No past surgical history on file.     Social History     Socioeconomic History    Marital status: Single   Tobacco Use    Smoking status: Never    Smokeless tobacco: Never   Substance and Sexual Activity    Alcohol use: Yes    Drug use: Never     Social Determinants of Health      Received from [facility]    Social Network   Intimate Partner Violence: Not At Risk (08/14/2023)    Received from [facility]    HITS     Over the last 12 months how often did your partner physically hurt you?: Never     Over the last 12 months how often did your partner insult you or talk down to you?: Never     Over the last 12 months how often did your partner threaten you with physical harm?: Never     Over the last 12 months how often did your partner scream or curse at you?: Never        Discharge Medication List as of 11/25/2023  7:25 PM        CONTINUE these medications which have NOT CHANGED    Details   pantoprazole (PROTONIX) 20 MG tablet Take 1 tablet by mouth every morning (before breakfast), Disp-30 tablet, R-0Print      ondansetron (ZOFRAN-ODT) 4 MG disintegrating tablet Take 1 tablet by mouth 3 times daily as needed for Nausea or Vomiting, Disp-21 tablet, R-0Print      diclofenac (VOLTAREN) 50 MG EC tablet Take 1 tablet by mouth 2 times daily as needed for Pain, Disp-60 tablet, R-1Normal      meloxicam (MOBIC) 15 MG tablet Take 1 tablet by mouth daily for 14 days, Disp-14 tablet, R-0Print              Results from this  emergency department visit:      Results for orders placed or performed during the hospital encounter of 11/25/23   CBC with Auto Differential   Result Value Ref Range    WBC 5.6 4.3 - 11.1 K/uL    RBC 4.81 4.23 - 5.6 M/uL    Hemoglobin 14.6 13.6 - 17.2 g/dL    Hematocrit 16.1 09.6 - 50.3 %    MCV 91.7 82 - 102 FL    MCH 30.4 26.1 - 32.9 PG    MCHC 33.1 31.4 - 35.0 g/dL    RDW 04.5 40.9 - 81.1 %    Platelets 204 150 - 450 K/uL    MPV 10.7 9.4 - 12.3 FL    nRBC 0.00 0.0 - 0.2 K/uL    Differential Type AUTOMATED      Neutrophils % 40.7 (L) 43.0 - 78.0 %    Lymphocytes % 50.0 (H) 13.0 - 44.0 %    Monocytes % 5.7 4.0 - 12.0 %    Eosinophils % 2.5 0.5 - 7.8 %  Basophils % 0.9 0.0 - 2.0 %    Immature Granulocytes % 0.2 0.0 - 5.0 %    Neutrophils Absolute 2.30 1.70 - 8.20 K/UL    Lymphocytes Absolute 2.82 0.50 - 4.60 K/UL    Monocytes Absolute 0.32 0.10 - 1.30 K/UL    Eosinophils Absolute 0.14 0.00 - 0.80 K/UL    Basophils Absolute 0.05 0.00 - 0.20 K/UL    Immature Granulocytes Absolute 0.01 0.0 - 0.5 K/UL   CMP   Result Value Ref Range    Sodium 139 136 - 145 mmol/L    Potassium 4.2 3.5 - 5.1 mmol/L    Chloride 105 98 - 107 mmol/L    CO2 28 20 - 29 mmol/L    Anion Gap 7 7 - 16 mmol/L    Glucose 100 (H) 70 - 99 mg/dL    BUN 6 6 - 23 MG/DL    Creatinine 1.61 0.96 - 1.30 MG/DL    Est, Glom Filt Rate >90 >60 ml/min/1.14m2    Calcium 8.4 (L) 8.8 - 10.2 MG/DL    Total Bilirubin <0.4 0.0 - 1.2 MG/DL    ALT 21 8 - 55 U/L    AST 30 15 - 37 U/L    Alk Phosphatase 43 40 - 129 U/L    Total Protein 6.1 (L) 6.3 - 8.2 g/dL    Albumin 3.3 (L) 3.5 - 5.0 g/dL    Globulin 2.8 2.3 - 3.5 g/dL    Albumin/Globulin Ratio 1.2 1.0 - 1.9     Urinalysis with Reflex to Culture    Specimen: Urine   Result Value Ref Range    Color, UA YELLOW/STRAW      Appearance CLEAR      Specific Gravity, UA 1.006 1.001 - 1.023      pH, Urine 7.0 5.0 - 9.0      Protein, UA Negative NEG mg/dL    Glucose, Ur Negative NEG mg/dL    Ketones, Urine Negative NEG mg/dL     Bilirubin, Urine Negative NEG      Blood, Urine Negative NEG      Urobilinogen, Urine 1.0 0.2 - 1.0 EU/dL    Nitrite, Urine Negative NEG      Leukocyte Esterase, Urine Negative NEG      Urine Culture if Indicated CULTURE NOT INDICATED BY UA RESULT      WBC, UA 0-4 U4 /hpf    RBC, UA 0-5 U5 /hpf    BACTERIA, URINE Negative NEG /hpf    Epithelial Cells, UA 0-5 U5 /hpf    Hyaline Casts, UA 0-2 /lpf    Casts 0 0 /lpf    Crystals 0 0 /LPF    Mucus, UA 0 0 /lpf         No orders to display                No results for input(s): "COVID19" in the last 72 hours.     Voice dictation software was used during the making of this note.  This software is not perfect and grammatical and other typographical errors may be present.  This note has not been completely proofread for errors.     Roby Lofts, MD  11/25/23 2046

## 2023-11-26 LAB — URINALYSIS WITH REFLEX TO CULTURE
BACTERIA, URINE: NEGATIVE /[HPF]
Bilirubin, Urine: NEGATIVE
Blood, Urine: NEGATIVE
Casts: 0 /[LPF]
Crystals: 0 /[LPF]
Glucose, Ur: NEGATIVE mg/dL
Ketones, Urine: NEGATIVE mg/dL
Leukocyte Esterase, Urine: NEGATIVE
Mucus, UA: 0 /[LPF]
Nitrite, Urine: NEGATIVE
Protein, UA: NEGATIVE mg/dL
Specific Gravity, UA: 1.006 (ref 1.001–1.023)
Urobilinogen, Urine: 1 U/dL (ref 0.2–1.0)
pH, Urine: 7 (ref 5.0–9.0)

## 2023-11-26 LAB — COMPREHENSIVE METABOLIC PANEL
ALT: 21 U/L (ref 8–55)
AST: 30 U/L (ref 15–37)
Albumin/Globulin Ratio: 1.2 (ref 1.0–1.9)
Albumin: 3.3 g/dL — ABNORMAL LOW (ref 3.5–5.0)
Alk Phosphatase: 43 U/L (ref 40–129)
Anion Gap: 7 mmol/L (ref 7–16)
BUN: 6 mg/dL (ref 6–23)
CO2: 28 mmol/L (ref 20–29)
Calcium: 8.4 mg/dL — ABNORMAL LOW (ref 8.8–10.2)
Chloride: 105 mmol/L (ref 98–107)
Creatinine: 0.91 mg/dL (ref 0.80–1.30)
Est, Glom Filt Rate: >90 mL/min/{1.73_m2} (ref >60)
Globulin: 2.8 g/dL (ref 2.3–3.5)
Glucose: 100 mg/dL — ABNORMAL HIGH (ref 70–99)
Potassium: 4.2 mmol/L (ref 3.5–5.1)
Sodium: 139 mmol/L (ref 136–145)
Total Bilirubin: <0.2 mg/dL (ref 0.0–1.2)
Total Protein: 6.1 g/dL — ABNORMAL LOW (ref 6.3–8.2)

## 2023-11-26 MED ORDER — LIDOCAINE 5 % EX PTCH
5 | MEDICATED_PATCH | Freq: Every day | CUTANEOUS | 0 refills | Status: AC
Start: 2023-11-26 — End: 2023-12-25

## 2023-11-26 MED ORDER — CYCLOBENZAPRINE HCL 10 MG PO TABS
10 | ORAL_TABLET | Freq: Two times a day (BID) | ORAL | 0 refills | Status: AC | PRN
Start: 2023-11-26 — End: 2023-12-05

## 2023-11-26 MED ORDER — KETOROLAC TROMETHAMINE 10 MG PO TABS
10 MG | ORAL_TABLET | Freq: Four times a day (QID) | ORAL | 0 refills | Status: DC | PRN
Start: 2023-11-26 — End: 2024-03-27

## 2023-11-27 ENCOUNTER — Encounter: Payer: PRIVATE HEALTH INSURANCE | Attending: Rehabilitative and Restorative Service Providers"

## 2023-11-29 ENCOUNTER — Encounter: Payer: PRIVATE HEALTH INSURANCE | Attending: Rehabilitative and Restorative Service Providers"

## 2023-11-30 ENCOUNTER — Encounter: Payer: PRIVATE HEALTH INSURANCE | Attending: Rehabilitative and Restorative Service Providers"

## 2023-12-01 ENCOUNTER — Encounter: Payer: PRIVATE HEALTH INSURANCE | Attending: Rehabilitative and Restorative Service Providers"

## 2023-12-06 ENCOUNTER — Encounter: Payer: PRIVATE HEALTH INSURANCE | Attending: Rehabilitative and Restorative Service Providers"

## 2023-12-08 ENCOUNTER — Encounter: Payer: PRIVATE HEALTH INSURANCE | Attending: Rehabilitative and Restorative Service Providers"

## 2023-12-12 ENCOUNTER — Encounter
Admit: 2023-12-12 | Discharge: 2023-12-12 | Payer: PRIVATE HEALTH INSURANCE | Attending: Rehabilitative and Restorative Service Providers"

## 2023-12-12 DIAGNOSIS — M25511 Pain in right shoulder: Secondary | ICD-10-CM

## 2023-12-12 NOTE — Progress Notes (Signed)
 [facility]  [place] [address]  Dept: [phone]      Physical Therapy Daily Note     Referring MD: Milinda Antis, MD  Diagnosis:     ICD-10-CM    1. Right shoulder pain, unspecified chronicity  M25.511       2. Decreased activities of daily living (ADL)  Z78.9       3. Impaired strength of shoulder muscles  R29.898       4. Decreased activity tolerance  R68.89          Therapy precautions:None  Co-morbidities affecting plan of care: None    Payor: Payor: Advertising copywriter /  /  /  Billing pattern: Self-Pay- No Insurance on file  Total Timed Procedure Codes: 45 min, Total Time: 45 min Modifier needed: No  Episode visit count:  11     PERTINENT MEDICAL HISTORY     Past medical and surgical history:   No past medical history on file.   No past surgical history on file.  Medications: reviewed in chart   Allergies: No Known Allergies     Diagnostic exams (per chart review): 06/21/23: Impression  1.  Mild rotator cuff tendinopathy without tear.  2.  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    SUBJECTIVE     Shoulder pain is the same.    Patient Stated Goals: Get my shoulder back to 100%, back to 'normal'.    OBJECTIVE     HAND DOMINANCE: right handed  FINDINGS:  [facility] SHOULDER SCORE 10/10/23 11/08/23 12/12/23   PAIN 10/30 8/30 7/30   SATISFACTION 1/10 3/10 6/10   FUNCTION 31/60 42/60 39/60    TOTAL 42/100 53/100 52/100     LEFT  (NON - INVOLVED) RIGHT SHOULDER AROM ()  (INVOLVED) 10/10/23   12/12/23   170 FE 127  155   158 ABDUCTION 130  157    REACHING TO OPPOSITE SHOULDER      REACHING TO BACK OF NECK      REACHING BEHIND BACK          RIGHT SHOULDER MMTing  (0 - 5) 12/12/23 COMMENTS   EXTENSION 5 *Indicates pain w resisted testing.   FLEXION 5*    ABDUCTION 5*    HORIZONTAL ABDUCTION NT    EXTERNAL ROTATION 5*    INTERNAL ROTATION 5*    ELBOW FLEXION 5*       Today's treatment:   PATIENT EDUCATION:   Patient performed each  exercise w PT correcting technique as needed w redemonstration, verbal or tactile cueing.   PT answering any patient questions.    [address]1478 - THERAPEUTIC EXERCISE: 45 MIN.  Addressing pain and deficits related to ROM and mm function - force production, endurance, neuromuscular coordination for completing ADLs.     EXERCISES:  To address pain and deficits related to ROM and mm function - force production, endurance, neuromuscular coordination for completing ADLs.    [x] UBE (seat position/intensity/duration): 6/3/5      Manually resisted exercise - patient supine: 90 sec each movement  [x] Flexion/extension   [x] ER/IR   [x] D1 flexion/extension   [x] D2 flexion/extension  [x] Abduction/adduction - side lying  [x] MT - Gr II - III PA and distraction glides of GHJ    Maintenance program:  [x] Quadrant stretch 5 sec hold/5  [x] Posterior capsule stretch 5 sec hold/5  Band resistance exercises  [x] Extension  [x] ER  [x] IR  [x] Presses   [  x]Curls    ASSESSMENT     Adam [place] has completed his PT POC.  His complaints are unchanged per his report.  He has not attended several PT sessions.  Due to lack of progress and attendance issues he is discharged.    Start of care: 08/24/2023   Progress Report Period: 08/23/24 to 12/15/23   As of 12/12/2023, [name] has attended 11 PT sessions.  Pt's attendance has been inconsistent with plan of care.  Pt has progressed slower than anticipated with treatment.  He has met 1/5 long term goals. He has subjective reports of 'the shoulder is the same'.  Per the [facility] Shoulder functional questionnaire function has improved 9 - 10 ponts over the course of therapy. A significant functional deficit of 48% remains at IE. Objective findings revealed limited R shoulder flexion but ~equal abduction when comparing involved to non-involved, NORMAL shoulder strength per MMT but w pain to resistance.  Pt has not demonstrated improvement that would indicate additional PT would be of benefit.      PLAN      Discharge with continued HEP.      [x]  All POC signed:  Go to Notes / Go to Scanned Items  [x]  Send message to Auth team to close referral: Go to InBox   [x]  Episode Resolved:  Go to Episode  [x]  All remaining visits canceled:  Go to Appt Desk  No future appointments.       PLAN        GOALS     LONG - TERM GOALS TO BE ATTAINED BY 12/01/23:  Patient will report infrequent shoulder pain of  <= 3/10 that does not limit mobility or ADLs. Goal Not Met 12/12/2023   Patient will return to normal sleep pattern re quality and duration. Goal Not Met 12/12/2023   Patient will attain shoulder function - ROM, mm strength - endurance, coordination of movement - that allows patient to:  Dress, sleep and perform light work w/o shoulder pain Goal Not Met 12/12/2023   Patient will be independent managing activities in order to avoid exacerbation of shoulder impairment.   Patient will be independent w a maintenance program. Goal Met 12/12/2023     [employer]    Maintenance prgram  Access Code: [email or web address]  URL: [email or web address]  Date: 08/28/2023  Prepared by: Salvadore Farber    Exercises  - Quadrant Stretch  - 1 x daily - 1 sets - 5 reps - 5 hold  - Shoulder extension with resistance - Neutral  - 1 x daily - 1 sets - 15 reps  - Shoulder External Rotation with Anchored Resistance with Towel Under Elbow  - 1 x daily - 1 sets - 15 reps  - T Band Resisted Shoulder IR  - 1 x daily - 1 sets - 15 reps  - T Band Resisted Biceps Curls  - 1 x daily - 1 sets - 15 reps

## 2024-03-27 ENCOUNTER — Inpatient Hospital Stay
Admit: 2024-03-27 | Discharge: 2024-03-28 | Disposition: A | Discharge status: Home / Self Care | Payer: PRIVATE HEALTH INSURANCE

## 2024-03-27 ENCOUNTER — Emergency Department: Admit: 2024-03-27 | Payer: PRIVATE HEALTH INSURANCE

## 2024-03-27 DIAGNOSIS — S300XXA Contusion of lower back and pelvis, initial encounter: Secondary | ICD-10-CM

## 2024-03-27 LAB — CBC WITH AUTO DIFFERENTIAL
Basophils %: 0.8 % (ref 0.0–2.0)
Basophils Absolute: 0.05 10*3/uL (ref 0.00–0.20)
Eosinophils %: 1.7 % (ref 0.5–7.8)
Eosinophils Absolute: 0.11 10*3/uL (ref 0.00–0.80)
Hematocrit: 40.9 % — ABNORMAL LOW (ref 41.1–50.3)
Hemoglobin: 13.6 g/dL (ref 13.6–17.2)
Immature Granulocytes %: 0.2 % (ref 0.0–5.0)
Immature Granulocytes Absolute: 0.01 10*3/uL (ref 0.0–0.5)
Lymphocytes %: 29.9 % (ref 13.0–44.0)
Lymphocytes Absolute: 1.9 10*3/uL (ref 0.50–4.60)
MCH: 31.6 pg (ref 26.1–32.9)
MCHC: 33.3 g/dL (ref 31.4–35.0)
MCV: 95.1 FL (ref 82–102)
MPV: 10.1 FL (ref 9.4–12.3)
Monocytes %: 6 % (ref 4.0–12.0)
Monocytes Absolute: 0.38 10*3/uL (ref 0.10–1.30)
Neutrophils %: 61.4 % (ref 43.0–78.0)
Neutrophils Absolute: 3.9 10*3/uL (ref 1.70–8.20)
Platelets: 278 10*3/uL (ref 150–450)
RBC: 4.3 M/uL (ref 4.23–5.6)
RDW: 12.9 % (ref 11.9–14.6)
WBC: 6.4 10*3/uL (ref 4.3–11.1)
nRBC: 0 10*3/uL (ref 0.0–0.2)

## 2024-03-27 LAB — COMPREHENSIVE METABOLIC PANEL
ALT: 25 U/L (ref 8–55)
AST: 37 U/L (ref 15–37)
Albumin/Globulin Ratio: 1.1 (ref 1.0–1.9)
Albumin: 3.2 g/dL — ABNORMAL LOW (ref 3.5–5.0)
Alk Phosphatase: 62 U/L (ref 40–129)
Anion Gap: 11 mmol/L (ref 7–16)
BUN: 12 mg/dL (ref 6–23)
CO2: 23 mmol/L (ref 20–29)
Calcium: 8.7 mg/dL — ABNORMAL LOW (ref 8.8–10.2)
Chloride: 105 mmol/L (ref 98–107)
Creatinine: 1.05 mg/dL (ref 0.80–1.30)
Est, Glom Filt Rate: >90 mL/min/{1.73_m2} (ref >60)
Globulin: 2.8 g/dL (ref 2.3–3.5)
Glucose: 93 mg/dL (ref 70–99)
Potassium: 3.9 mmol/L (ref 3.5–5.1)
Sodium: 139 mmol/L (ref 136–145)
Total Bilirubin: 0.2 mg/dL (ref 0.0–1.2)
Total Protein: 6 g/dL — ABNORMAL LOW (ref 6.3–8.2)

## 2024-03-27 LAB — PROTIME-INR
INR: 0.9
Protime: 12.6 s (ref 11.3–14.9)

## 2024-03-27 MED ORDER — LACTATED RINGERS IV BOLUS
Freq: Once | INTRAVENOUS | Status: AC
Start: 2024-03-27 — End: 2024-03-27
  Administered 2024-03-27: 23:00:00 1000 mL via INTRAVENOUS

## 2024-03-27 MED ORDER — MORPHINE SULFATE (PF) 4 MG/ML IJ SOLN
4 | INTRAMUSCULAR | Status: AC
Start: 2024-03-27 — End: 2024-03-27
  Administered 2024-03-28: 01:00:00 4 mg via INTRAVENOUS

## 2024-03-27 MED ORDER — IOPAMIDOL 76 % IV SOLN
76 | Freq: Once | INTRAVENOUS | Status: AC | PRN
Start: 2024-03-27 — End: 2024-03-27
  Administered 2024-03-28: 100 mL via INTRAVENOUS

## 2024-03-27 MED ORDER — ONDANSETRON HCL 4 MG/2ML IJ SOLN
4 | Freq: Once | INTRAMUSCULAR | Status: AC
Start: 2024-03-27 — End: 2024-03-27
  Administered 2024-03-28: 01:00:00 4 mg via INTRAVENOUS
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
## 2024-03-27 NOTE — Discharge Instructions (Addendum)
 Rest is much as possible  Ice to the area 20 minutes each hour while awake for the first 48 hours and then 4-6 times daily as needed for any pain or swelling  No moist heat to the area as the heat can cause expansion of the bruising  It may take up to 6 weeks for the bruising to finally resolved completely  CT of the chest abdomen pelvis are all reassuring.  With no signs of traumatic injury.  And your repeat abdominal exam is reassuring for no traumatic injury to the abdomen.  Use the medications as directed  The muscle relaxer can cause drowsiness and dizziness.  Do not use it if you have to be alert  Make sure to follow-up with your primary care this week for recheck exam or with the [address] [facility] primary care call their office for an appointment  If you have any worsening in your symptoms develop any redness or swelling, fever over 100.5, or any other worsening symptoms return to the ER right away
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
## 2024-03-27 NOTE — ED Notes (Signed)
 Patient mobility status  with no difficulty.     I have reviewed discharge instructions with the patient.  The patient verbalized understanding.    Patient left ED via Discharge Method: ambulatory to Home with  self .    Opportunity for questions and clarification provided.     Patient given 2 scripts.           Sharyne Degree, RN  03/27/24 2234

## 2024-03-28 LAB — POCT URINALYSIS DIPSTICK
Bilirubin, Urine, POC: NEGATIVE
Blood, UA POC: NEGATIVE
Glucose, UA POC: NEGATIVE mg/dL
Ketones, Urine, POC: NEGATIVE mg/dL
Leukocyte Est, UA POC: NEGATIVE
Nitrite, Urine, POC: NEGATIVE
Protein, Urine, POC: NEGATIVE mg/dL
Specific Gravity, Urine, POC: >1.03 — ABNORMAL HIGH (ref 1.001–1.023)
URINE UROBILINOGEN POC: >=8 EU/dL (ref 0.2–1.0)
pH, Urine, POC: 6 (ref 5.0–9.0)

## 2024-03-28 LAB — URINALYSIS
Bilirubin, Urine: NEGATIVE
Blood, Urine: NEGATIVE
Glucose, Ur: NEGATIVE mg/dL
Leukocyte Esterase, Urine: NEGATIVE
Nitrite, Urine: NEGATIVE
Protein, UA: NEGATIVE mg/dL
Specific Gravity, UA: 1.023 (ref 1.001–1.023)
Urobilinogen, Urine: 4 EU/dL — ABNORMAL HIGH (ref 0.2–1.0)
pH, Urine: 6 (ref 5.0–9.0)

## 2024-03-28 MED ORDER — IBUPROFEN 600 MG PO TABS
600 | ORAL_TABLET | Freq: Four times a day (QID) | ORAL | 0 refills | 15.00000 days | Status: AC | PRN
Start: 2024-03-28 — End: ?

## 2024-03-28 MED ORDER — CYCLOBENZAPRINE HCL 10 MG PO TABS
10 | ORAL_TABLET | Freq: Three times a day (TID) | ORAL | 0 refills | 14.00000 days | Status: AC | PRN
Start: 2024-03-28 — End: 2024-04-06

## 2024-03-28 MED FILL — MORPHINE SULFATE 4 MG/ML IJ SOLN: 4 mg/mL | INTRAMUSCULAR | Qty: 1 | Fill #0

## 2024-03-28 MED FILL — ONDANSETRON HCL 4 MG/2ML IJ SOLN: 4 MG/2ML | INTRAMUSCULAR | Qty: 2 | Fill #0

## 2024-05-04 ENCOUNTER — Emergency Department: Admit: 2024-05-04 | Payer: PRIVATE HEALTH INSURANCE

## 2024-05-04 ENCOUNTER — Inpatient Hospital Stay
Admit: 2024-05-04 | Discharge: 2024-05-04 | Disposition: A | Discharge status: Home / Self Care | Payer: PRIVATE HEALTH INSURANCE | Arrived: AM | Attending: Emergency Medicine

## 2024-05-04 DIAGNOSIS — S93402A Sprain of unspecified ligament of left ankle, initial encounter: Principal | ICD-10-CM
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
## 2024-05-04 NOTE — Discharge Instructions (Signed)
 You can use Tylenol  or Motrin  over the counter for pain.  Ice 20 min at a time, 3x per day.

## 2024-05-04 NOTE — ED Notes (Signed)
 Patient mobility status ambulates with no difficulty.     I have reviewed discharge instructions with the patient.  The patient verbalized understanding.    Patient left ED via Discharge Method: ambulatory to Home with Friend.    Opportunity for questions and clarification provided.     Patient given 0 scripts.           Faustino Clap, RN  05/04/24 [phone]

## 2024-05-04 NOTE — ED Notes (Signed)
 Scheduled a roundtrip for pt.      Adam Snyder  05/04/24 [id number]

## 2024-05-04 NOTE — ED Triage Notes (Signed)
 Arrived via [facility], coming a friends house, C/o walking and felt a pop in his ankle x 6 hours, c/o pain LLE

## 2024-10-26 NOTE — Progress Notes (Addendum)
"  Patient called stating he had numbness and tingling in his hand.  Saw [name] about one year ago and Prentice Mulders before that.  Discussed having our office give him a call for FU.  Discussed OTC oral medication and going to urgent care if symptoms persist and he cannot wait.   "

## 2024-10-28 NOTE — Telephone Encounter (Signed)
"  Levis Back, MA  [facility] [employer]  Hi team!    This patient has established care with both Dr. Bluford and Prentice Mulders. Given that he's experiencing numbness and tingling, could someone reach out and have him scheduled for a recheck with [facility] team first to assess whether this is neck related? Thanks in advance!          10/28/24/dt  LVM   to pt  to call to  schedule  with  [facility]   "

## 2024-12-09 ENCOUNTER — Encounter: Payer: PRIVATE HEALTH INSURANCE | Attending: Physician Assistant
